# Patient Record
Sex: Male | Born: 1950 | Race: White | Hispanic: No | Smoking: Former smoker
Health system: Southern US, Community
[De-identification: ages and names within clinical notes are randomized; demographics above are authoritative.]

## PROBLEM LIST (undated history)

## (undated) DIAGNOSIS — E119 Type 2 diabetes mellitus without complications: Secondary | ICD-10-CM

## (undated) DIAGNOSIS — I509 Heart failure, unspecified: Secondary | ICD-10-CM

## (undated) DIAGNOSIS — J449 Chronic obstructive pulmonary disease, unspecified: Secondary | ICD-10-CM

## (undated) DIAGNOSIS — F419 Anxiety disorder, unspecified: Secondary | ICD-10-CM

## (undated) DIAGNOSIS — I10 Essential (primary) hypertension: Secondary | ICD-10-CM

## (undated) HISTORY — PX: CORONARY ARTERY BYPASS GRAFT: SHX141

## (undated) HISTORY — PX: CARDIAC CATHETERIZATION: SHX172

## (undated) HISTORY — PX: BACK SURGERY: SHX140

---

## 2014-09-26 ENCOUNTER — Inpatient Hospital Stay (HOSPITAL_COMMUNITY): Payer: Medicaid - Out of State

## 2014-09-26 ENCOUNTER — Inpatient Hospital Stay (HOSPITAL_COMMUNITY)
Admission: EM | Admit: 2014-09-26 | Discharge: 2014-09-29 | DRG: 208 | Disposition: A | Discharge status: Home / Self Care | Payer: Medicaid - Out of State | Source: Other Acute Inpatient Hospital | Attending: Internal Medicine | Admitting: Internal Medicine

## 2014-09-26 DIAGNOSIS — Z9981 Dependence on supplemental oxygen: Secondary | ICD-10-CM | POA: Diagnosis not present

## 2014-09-26 DIAGNOSIS — J9601 Acute respiratory failure with hypoxia: Secondary | ICD-10-CM | POA: Diagnosis present

## 2014-09-26 DIAGNOSIS — Y95 Nosocomial condition: Secondary | ICD-10-CM | POA: Diagnosis present

## 2014-09-26 DIAGNOSIS — J189 Pneumonia, unspecified organism: Secondary | ICD-10-CM | POA: Diagnosis present

## 2014-09-26 DIAGNOSIS — G8929 Other chronic pain: Secondary | ICD-10-CM | POA: Diagnosis present

## 2014-09-26 DIAGNOSIS — G4733 Obstructive sleep apnea (adult) (pediatric): Secondary | ICD-10-CM | POA: Diagnosis present

## 2014-09-26 DIAGNOSIS — J441 Chronic obstructive pulmonary disease with (acute) exacerbation: Secondary | ICD-10-CM | POA: Diagnosis present

## 2014-09-26 DIAGNOSIS — F418 Other specified anxiety disorders: Secondary | ICD-10-CM | POA: Diagnosis present

## 2014-09-26 DIAGNOSIS — E119 Type 2 diabetes mellitus without complications: Secondary | ICD-10-CM | POA: Diagnosis present

## 2014-09-26 DIAGNOSIS — Z4659 Encounter for fitting and adjustment of other gastrointestinal appliance and device: Secondary | ICD-10-CM

## 2014-09-26 DIAGNOSIS — F17210 Nicotine dependence, cigarettes, uncomplicated: Secondary | ICD-10-CM | POA: Diagnosis present

## 2014-09-26 DIAGNOSIS — J9690 Respiratory failure, unspecified, unspecified whether with hypoxia or hypercapnia: Secondary | ICD-10-CM | POA: Diagnosis present

## 2014-09-26 DIAGNOSIS — G629 Polyneuropathy, unspecified: Secondary | ICD-10-CM | POA: Diagnosis present

## 2014-09-26 DIAGNOSIS — Z7982 Long term (current) use of aspirin: Secondary | ICD-10-CM

## 2014-09-26 DIAGNOSIS — M549 Dorsalgia, unspecified: Secondary | ICD-10-CM | POA: Diagnosis present

## 2014-09-26 DIAGNOSIS — Z09 Encounter for follow-up examination after completed treatment for conditions other than malignant neoplasm: Secondary | ICD-10-CM

## 2014-09-26 DIAGNOSIS — M4806 Spinal stenosis, lumbar region: Secondary | ICD-10-CM | POA: Diagnosis present

## 2014-09-26 DIAGNOSIS — D649 Anemia, unspecified: Secondary | ICD-10-CM | POA: Diagnosis present

## 2014-09-26 DIAGNOSIS — J9621 Acute and chronic respiratory failure with hypoxia: Secondary | ICD-10-CM | POA: Diagnosis present

## 2014-09-26 DIAGNOSIS — I10 Essential (primary) hypertension: Secondary | ICD-10-CM | POA: Diagnosis present

## 2014-09-26 DIAGNOSIS — J439 Emphysema, unspecified: Secondary | ICD-10-CM | POA: Diagnosis not present

## 2014-09-26 DIAGNOSIS — I252 Old myocardial infarction: Secondary | ICD-10-CM | POA: Diagnosis not present

## 2014-09-26 DIAGNOSIS — I5023 Acute on chronic systolic (congestive) heart failure: Secondary | ICD-10-CM | POA: Diagnosis present

## 2014-09-26 DIAGNOSIS — I2510 Atherosclerotic heart disease of native coronary artery without angina pectoris: Secondary | ICD-10-CM | POA: Diagnosis present

## 2014-09-26 HISTORY — DX: Heart failure, unspecified: I50.9

## 2014-09-26 HISTORY — DX: Chronic obstructive pulmonary disease, unspecified: J44.9

## 2014-09-26 HISTORY — DX: Essential (primary) hypertension: I10

## 2014-09-26 HISTORY — DX: Type 2 diabetes mellitus without complications: E11.9

## 2014-09-26 HISTORY — DX: Anxiety disorder, unspecified: F41.9

## 2014-09-26 LAB — CBC WITH DIFFERENTIAL/PLATELET
BASOS ABS: 0 10*3/uL (ref 0.0–0.1)
BASOS PCT: 0 % (ref 0–1)
EOS PCT: 2 % (ref 0–5)
Eosinophils Absolute: 0.2 10*3/uL (ref 0.0–0.7)
HEMATOCRIT: 35.2 % — AB (ref 39.0–52.0)
Hemoglobin: 11.1 g/dL — ABNORMAL LOW (ref 13.0–17.0)
Lymphocytes Relative: 20 % (ref 12–46)
Lymphs Abs: 1.6 10*3/uL (ref 0.7–4.0)
MCH: 27.1 pg (ref 26.0–34.0)
MCHC: 31.5 g/dL (ref 30.0–36.0)
MCV: 85.9 fL (ref 78.0–100.0)
Monocytes Absolute: 0.5 10*3/uL (ref 0.1–1.0)
Monocytes Relative: 7 % (ref 3–12)
Neutro Abs: 5.8 10*3/uL (ref 1.7–7.7)
Neutrophils Relative %: 71 % (ref 43–77)
Platelets: 294 10*3/uL (ref 150–400)
RBC: 4.1 MIL/uL — ABNORMAL LOW (ref 4.22–5.81)
RDW: 17.4 % — AB (ref 11.5–15.5)
WBC: 8.1 10*3/uL (ref 4.0–10.5)

## 2014-09-26 LAB — COMPREHENSIVE METABOLIC PANEL
ALK PHOS: 79 U/L (ref 39–117)
ALT: 13 U/L (ref 0–53)
ANION GAP: 7 (ref 5–15)
AST: 15 U/L (ref 0–37)
Albumin: 3.3 g/dL — ABNORMAL LOW (ref 3.5–5.2)
BUN: 13 mg/dL (ref 6–23)
CALCIUM: 8.9 mg/dL (ref 8.4–10.5)
CO2: 30 mmol/L (ref 19–32)
Chloride: 101 mmol/L (ref 96–112)
Creatinine, Ser: 1.02 mg/dL (ref 0.50–1.35)
GFR calc Af Amer: 88 mL/min — ABNORMAL LOW (ref >90)
GFR, EST NON AFRICAN AMERICAN: 76 mL/min — AB (ref >90)
Glucose, Bld: 108 mg/dL — ABNORMAL HIGH (ref 70–99)
Potassium: 4 mmol/L (ref 3.5–5.1)
Sodium: 138 mmol/L (ref 135–145)
TOTAL PROTEIN: 7.1 g/dL (ref 6.0–8.3)
Total Bilirubin: 1.1 mg/dL (ref 0.3–1.2)

## 2014-09-26 LAB — GLUCOSE, CAPILLARY
GLUCOSE-CAPILLARY: 104 mg/dL — AB (ref 70–99)
GLUCOSE-CAPILLARY: 107 mg/dL — AB (ref 70–99)
GLUCOSE-CAPILLARY: 100 mg/dL — AB (ref 70–99)
Glucose-Capillary: 103 mg/dL — ABNORMAL HIGH (ref 70–99)
Glucose-Capillary: 138 mg/dL — ABNORMAL HIGH (ref 70–99)
Glucose-Capillary: 142 mg/dL — ABNORMAL HIGH (ref 70–99)

## 2014-09-26 LAB — MRSA PCR SCREENING: MRSA BY PCR: NEGATIVE

## 2014-09-26 LAB — POCT I-STAT 3, ART BLOOD GAS (G3+)
ACID-BASE EXCESS: 2 mmol/L (ref 0.0–2.0)
BICARBONATE: 28 meq/L — AB (ref 20.0–24.0)
O2 Saturation: 97 %
PCO2 ART: 47.7 mmHg — AB (ref 35.0–45.0)
PO2 ART: 98 mmHg (ref 80.0–100.0)
TCO2: 29 mmol/L (ref 0–100)
pH, Arterial: 7.378 (ref 7.350–7.450)

## 2014-09-26 LAB — TROPONIN I
Troponin I: 0.13 ng/mL — ABNORMAL HIGH (ref <0.031)
Troponin I: 0.12 ng/mL — ABNORMAL HIGH (ref <0.031)
Troponin I: 0.09 ng/mL — ABNORMAL HIGH (ref <0.031)

## 2014-09-26 MED ORDER — DEXTROSE 5 % IV SOLN
500.0000 mg | INTRAVENOUS | Status: DC
  Administered 2014-09-26 – 2014-09-27 (×2): 500 mg via INTRAVENOUS
  Filled 2014-09-26 (×2): qty 500

## 2014-09-26 MED ORDER — ALBUTEROL SULFATE (2.5 MG/3ML) 0.083% IN NEBU
2.5000 mg | INHALATION_SOLUTION | RESPIRATORY_TRACT | Status: DC | PRN
  Administered 2014-09-29: 2.5 mg via RESPIRATORY_TRACT
  Filled 2014-09-26: qty 3

## 2014-09-26 MED ORDER — FENTANYL CITRATE 0.05 MG/ML IJ SOLN
50.0000 ug | Freq: Once | INTRAMUSCULAR | Status: DC
Start: 2014-09-26 — End: 2014-09-27

## 2014-09-26 MED ORDER — ASPIRIN 81 MG PO CHEW
81.0000 mg | CHEWABLE_TABLET | Freq: Every day | ORAL | Status: DC
  Administered 2014-09-26 – 2014-09-29 (×4): 81 mg
  Filled 2014-09-26 (×4): qty 1

## 2014-09-26 MED ORDER — CEFTRIAXONE SODIUM IN DEXTROSE 20 MG/ML IV SOLN
1.0000 g | INTRAVENOUS | Status: DC
  Administered 2014-09-26 – 2014-09-29 (×4): 1 g via INTRAVENOUS
  Filled 2014-09-26 (×5): qty 50

## 2014-09-26 MED ORDER — QUETIAPINE FUMARATE 100 MG PO TABS
100.0000 mg | ORAL_TABLET | Freq: Once | ORAL | Status: AC
  Administered 2014-09-26: 100 mg via ORAL
  Filled 2014-09-26: qty 1

## 2014-09-26 MED ORDER — CHLORHEXIDINE GLUCONATE 0.12 % MT SOLN
15.0000 mL | Freq: Two times a day (BID) | OROMUCOSAL | Status: DC
  Administered 2014-09-26 (×2): 15 mL via OROMUCOSAL
  Filled 2014-09-26 (×3): qty 15

## 2014-09-26 MED ORDER — IPRATROPIUM-ALBUTEROL 0.5-2.5 (3) MG/3ML IN SOLN
3.0000 mL | Freq: Four times a day (QID) | RESPIRATORY_TRACT | Status: DC
  Administered 2014-09-26 – 2014-09-29 (×13): 3 mL via RESPIRATORY_TRACT
  Filled 2014-09-26 (×11): qty 3

## 2014-09-26 MED ORDER — POTASSIUM CHLORIDE 20 MEQ/15ML (10%) PO SOLN
40.0000 meq | Freq: Every day | ORAL | Status: DC
  Filled 2014-09-26: qty 30

## 2014-09-26 MED ORDER — HEPARIN SODIUM (PORCINE) 5000 UNIT/ML IJ SOLN
5000.0000 [IU] | Freq: Three times a day (TID) | INTRAMUSCULAR | Status: DC
  Administered 2014-09-26 – 2014-09-29 (×10): 5000 [IU] via SUBCUTANEOUS
  Filled 2014-09-26 (×11): qty 1

## 2014-09-26 MED ORDER — FUROSEMIDE 10 MG/ML IJ SOLN
20.0000 mg | Freq: Every day | INTRAMUSCULAR | Status: DC
  Administered 2014-09-26 – 2014-09-27 (×2): 20 mg via INTRAVENOUS
  Filled 2014-09-26 (×3): qty 2

## 2014-09-26 MED ORDER — PANTOPRAZOLE SODIUM 40 MG PO PACK
40.0000 mg | PACK | Freq: Every day | ORAL | Status: DC
  Administered 2014-09-26 – 2014-09-27 (×2): 40 mg
  Filled 2014-09-26 (×2): qty 20

## 2014-09-26 MED ORDER — SODIUM CHLORIDE 0.9 % IV BOLUS (SEPSIS)
500.0000 mL | Freq: Once | INTRAVENOUS | Status: AC
  Administered 2014-09-26: 500 mL via INTRAVENOUS

## 2014-09-26 MED ORDER — FUROSEMIDE 10 MG/ML IJ SOLN
40.0000 mg | Freq: Every day | INTRAMUSCULAR | Status: DC
Start: 2014-09-26 — End: 2014-09-26

## 2014-09-26 MED ORDER — ALPRAZOLAM 0.5 MG PO TABS
1.0000 mg | ORAL_TABLET | Freq: Three times a day (TID) | ORAL | Status: DC | PRN
  Administered 2014-09-26 – 2014-09-27 (×2): 1 mg via ORAL
  Filled 2014-09-26 (×2): qty 2

## 2014-09-26 MED ORDER — SODIUM CHLORIDE 0.9 % IV SOLN
25.0000 ug/h | INTRAVENOUS | Status: DC
  Administered 2014-09-26: 25 ug/h via INTRAVENOUS
  Filled 2014-09-26 (×2): qty 50

## 2014-09-26 MED ORDER — CLOPIDOGREL BISULFATE 75 MG PO TABS
75.0000 mg | ORAL_TABLET | Freq: Every day | ORAL | Status: DC
  Administered 2014-09-26 – 2014-09-29 (×4): 75 mg
  Filled 2014-09-26 (×5): qty 1

## 2014-09-26 MED ORDER — FENTANYL BOLUS VIA INFUSION
50.0000 ug | INTRAVENOUS | Status: DC | PRN
  Administered 2014-09-26 (×2): 50 ug via INTRAVENOUS
  Filled 2014-09-26: qty 50

## 2014-09-26 MED ORDER — SODIUM CHLORIDE 0.9 % IV SOLN: 250.0000 mL | INTRAVENOUS | Status: DC | PRN

## 2014-09-26 MED ORDER — FUROSEMIDE 10 MG/ML IJ SOLN
40.0000 mg | Freq: Once | INTRAMUSCULAR | Status: AC
  Administered 2014-09-26: 40 mg via INTRAVENOUS
  Filled 2014-09-26: qty 4

## 2014-09-26 MED ORDER — SODIUM CHLORIDE 0.9 % IV SOLN: INTRAVENOUS | Status: DC

## 2014-09-26 MED ORDER — POTASSIUM CHLORIDE 20 MEQ/15ML (10%) PO SOLN
40.0000 meq | Freq: Once | ORAL | Status: AC
  Administered 2014-09-26: 40 meq
  Filled 2014-09-26: qty 30

## 2014-09-26 MED ORDER — PANTOPRAZOLE SODIUM 40 MG IV SOLR: 40.0000 mg | INTRAVENOUS | Status: DC

## 2014-09-26 MED ORDER — QUETIAPINE FUMARATE 100 MG PO TABS: 100.0000 mg | ORAL_TABLET | Freq: Every day | ORAL | Status: DC

## 2014-09-26 MED ORDER — QUETIAPINE FUMARATE 200 MG PO TABS
200.0000 mg | ORAL_TABLET | Freq: Every day | ORAL | Status: DC
  Administered 2014-09-26 – 2014-09-28 (×3): 200 mg via ORAL
  Filled 2014-09-26 (×4): qty 1

## 2014-09-26 MED ORDER — CARVEDILOL 3.125 MG PO TABS
3.1250 mg | ORAL_TABLET | Freq: Two times a day (BID) | ORAL | Status: DC
  Administered 2014-09-26 (×2): 3.125 mg via ORAL
  Filled 2014-09-26 (×5): qty 1

## 2014-09-26 MED ORDER — INSULIN ASPART 100 UNIT/ML ~~LOC~~ SOLN
0.0000 [IU] | SUBCUTANEOUS | Status: DC
  Administered 2014-09-26 (×2): 1 [IU] via SUBCUTANEOUS

## 2014-09-26 MED ORDER — FUROSEMIDE 10 MG/ML IJ SOLN
40.0000 mg | Freq: Once | INTRAMUSCULAR | Status: AC
  Administered 2014-09-26: 40 mg via INTRAVENOUS

## 2014-09-26 MED ORDER — VANCOMYCIN HCL IN DEXTROSE 1-5 GM/200ML-% IV SOLN
1000.0000 mg | Freq: Three times a day (TID) | INTRAVENOUS | Status: DC
  Administered 2014-09-26 – 2014-09-28 (×6): 1000 mg via INTRAVENOUS
  Filled 2014-09-26 (×10): qty 200

## 2014-09-26 MED ORDER — METHYLPREDNISOLONE SODIUM SUCC 40 MG IJ SOLR
40.0000 mg | INTRAMUSCULAR | Status: AC
  Administered 2014-09-27: 40 mg via INTRAVENOUS
  Filled 2014-09-26: qty 1

## 2014-09-26 MED ORDER — METHYLPREDNISOLONE SODIUM SUCC 40 MG IJ SOLR
40.0000 mg | Freq: Four times a day (QID) | INTRAMUSCULAR | Status: DC
  Administered 2014-09-26: 40 mg via INTRAVENOUS
  Filled 2014-09-26 (×5): qty 1

## 2014-09-26 MED ORDER — IPRATROPIUM-ALBUTEROL 0.5-2.5 (3) MG/3ML IN SOLN
RESPIRATORY_TRACT | Status: AC
  Administered 2014-09-26: 3 mL via RESPIRATORY_TRACT
  Filled 2014-09-26: qty 3

## 2014-09-26 MED ORDER — CETYLPYRIDINIUM CHLORIDE 0.05 % MT LIQD
7.0000 mL | Freq: Four times a day (QID) | OROMUCOSAL | Status: DC
Start: 2014-09-26 — End: 2014-09-27
  Administered 2014-09-26 – 2014-09-27 (×4): 7 mL via OROMUCOSAL

## 2014-09-26 MED ORDER — NICOTINE 21 MG/24HR TD PT24
21.0000 mg | MEDICATED_PATCH | Freq: Every day | TRANSDERMAL | Status: DC
  Administered 2014-09-26 – 2014-09-29 (×4): 21 mg via TRANSDERMAL
  Filled 2014-09-26 (×4): qty 1

## 2014-09-26 NOTE — Progress Notes (Signed)
ANTIBIOTIC CONSULT NOTE - INITIAL  Pharmacy Consult for vanco Indication: pneumonia  No Known Allergies  Patient Measurements: Weight: 180 lb 5.4 oz (81.8 kg) Adjusted Body Weight:    Vital Signs: Temp: 98 F (36.7 C) (02/18 0848) Temp Source: Oral (02/18 0848) BP: 126/67 mmHg (02/18 0800) Pulse Rate: 103 (02/18 0800) Intake/Output from previous day: 02/17 0701 - 02/18 0700 In: 330.9 [I.V.:30.9; IV Piggyback:300] Out: 1800 [Urine:1800] Intake/Output from this shift:    Labs:  Recent Labs  09/26/14 0330  WBC 8.1  HGB 11.1*  PLT 294  CREATININE 1.02   CrCl cannot be calculated (Unknown ideal weight.). No results for input(s): VANCOTROUGH, VANCOPEAK, VANCORANDOM, GENTTROUGH, GENTPEAK, GENTRANDOM, TOBRATROUGH, TOBRAPEAK, TOBRARND, AMIKACINPEAK, AMIKACINTROU, AMIKACIN in the last 72 hours.   Microbiology: Recent Results (from the past 720 hour(s))  MRSA PCR Screening     Status: None   Collection Time: 09/26/14  2:47 AM  Result Value Ref Range Status   MRSA by PCR NEGATIVE NEGATIVE Final    Comment:        The GeneXpert MRSA Assay (FDA approved for NASAL specimens only), is one component of a comprehensive MRSA colonization surveillance program. It is not intended to diagnose MRSA infection nor to guide or monitor treatment for MRSA infections.     Medical History: No past medical history on file.   Medications: f/u med rec  Assessment: [facility]  Anticoagulation: SQ heparin. Hgb 11.1. Plts ok  Infectious Disease: HCAP vs COPD exac. Afebrile. WBC 8.1.Rocephin/Azithro. Add Vanco empirically and deescalate if trach aspirate negative.  Cardiovascular: HF with EF 35%, MI, CABG, HTN, VSs but HR 103. Meds: ASA81, Plavix, IV lasix, tube PPI  Endocrinology: DM on SSI  Gastrointestinal / Nutrition: IV PPI  Neurology: Lumbar stenosis with neurogenic claudication,  Depression/Anxiety, schizophrenia.  Nicotine patch, sedated on Fent drip. Resume Seroquel  Nephrology: Scr 1.02  Pulmonary: OSA, COPD, +tobacco (1ppd, quite 2wks ago, requires patch), 3L o2 at home. VDRF on nebs, Solumedrol, Wean.   Hematology / Oncology  PTA Medication Issues: f/u med rec  Best Practices: SQ heparin, PPI, mouth care  Goal of Therapy:  Vancomycin trough level 15-20 mcg/ml  Plan:  Vancomycin 1g IV q8hr. Trough after 3-5 doses at steady state if continued.   Ciarrah Rae S. Merilynn Finlandobertson, PharmD, BCPS Clinical Staff Pharmacist Pager [phone]  Misty Stanleyobertson, Kennet Mccort Stillinger 09/26/2014,9:14 AM

## 2014-09-26 NOTE — H&P (Signed)
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 EDP thought he was in acute HF due to elevated BNP (~500); He was given Lasix and intubated prior to transfer. He reports fevers/chills, increased SOB and sputum production recently. Denies current CP, Ab pain.   PAST MEDICAL HISTORY :   has no past medical history on file.  has no past surgical history on file. Prior to Admission medications   Not on File   Allergies not on file  FAMILY HISTORY:  has no family status information on file.  SOCIAL HISTORY:    REVIEW OF SYSTEMS:  Unable to obtain; Intubated  SUBJECTIVE:   VITAL SIGNS: FiO2 (%):  [70 %] 70 % (02/18 0245) HEMODYNAMICS:   VENTILATOR SETTINGS: Vent Mode:  [-] PRVC FiO2 (%):  [70 %] 70 % Set Rate:  [14 bmp] 14 bmp Vt Set:  [500 mL] 500 mL PEEP:  [5 cmH20] 5 cmH20 INTAKE / OUTPUT: No intake or output data in the 24 hours ending 09/26/14 0252  PHYSICAL EXAMINATION: General:  Male, Intubated, NAD Neuro:  Alert; Follows commands; Nonfocal exam HEENT:  MMM; ET tube in place Cardiovascular:  RRR no m/r/g Lungs:  Diffusely decreased breath sound; No crackles  Abdomen:  SNTND Musculoskeletal:   Normal tone Skin:  No rash  LABS:  CBC No results for input(s): WBC, HGB, HCT, PLT in the last 168 hours. Coag's No results for input(s): APTT, INR in the last 168 hours. BMET No results for input(s): NA, K, CL, CO2, BUN, CREATININE, GLUCOSE in the last 168 hours. Electrolytes No results for input(s): CALCIUM, MG, PHOS in the last 168 hours. Sepsis Markers No results for input(s): LATICACIDVEN, PROCALCITON, O2SATVEN in the last 168 hours. ABG No results for input(s): PHART, PCO2ART, PO2ART in the last 168 hours. Liver Enzymes No results for input(s): AST, ALT, ALKPHOS, BILITOT, ALBUMIN in the last 168 hours. Cardiac Enzymes No results for input(s): TROPONINI, PROBNP in the last 168 hours. Glucose No results for input(s): GLUCAP in the last 168 hours.  Imaging No results found.   ASSESSMENT / PLAN:  PULMONARY OETT 2/18>> A: Hypercarbic respiratory failure COPD w/ home O2 3L OSA on CPAP Smoker P:   Full Vent Solumedrol  q6hrs Dounebs  CARDIOVASCULAR A:  HTN Hx of MI Hx of CABGx4 P:  Continue ASA 81 mg & Plavix Lasix IV 40 BID w/ Potassium  qd Holding home: Lipitor, Coreg, Norvasc; Lisinopril  RENAL A:   P:   Check bmet  GASTROINTESTINAL A:   P:   NPO KVO fluids PPI IV BID; Holding home pepcid  HEMATOLOGIC A:   Anemia - Normocytic  P:  - Hgb 11.1 - No acute signs of bleeding - Monitor cbc  INFECTIOUS A:  CAP P:   BCx2 None UC None Sputum None Abx: CTX & Azithro, start date 2/18, day 1/7-10  ENDOCRINE A:    DM2 - No home meds P:   CBG q 4 w/ SSI  NEUROLOGIC A:   Lumbar stenosis with neurogenic claudication Depression/Anxiety  P:   Holding home Gabapentin; Oxycodone; Robaxin; xanax; Seroquel RASS goal: 0, -1  FAMILY  - Updates: No family at bedside  - Inter-disciplinary family meet or Palliative Care meeting due by:  10/02/14  Jamal CollinJames R Preesha Benjamin, MD 09/26/2014, 4:02 AM PGY-2, [facility]  Pulmonary and  Critical Care Medicine  HealthCare Pager: [phone]

## 2014-09-26 NOTE — Procedures (Signed)
Extubation Procedure Note  Patient Details:   Name: Eric Hensley DOB: March 22, 1951 MRN: [id number]   Pt self-extubated. Pt placed on NRB post extubation.  Evaluation  O2 sats: stable throughout Complications: No apparent complications Patient did tolerate procedure well. Bilateral Breath Sounds: Diminished   Yes  Fredrich BirksMarshburn, Kara Melching Lynne 09/26/2014, 11:32 PM

## 2014-09-26 NOTE — Progress Notes (Addendum)
Self extubated; nearly    Looks ok on exam  GAve good cough and followed commands Appears calm  PULMONARY  Recent Labs Lab 09/26/14 0423  PHART 7.378  PCO2ART 47.7*  PO2ART 98.0  HCO3 28.0*  TCO2 29  O2SAT 97.0    CBC  Recent Labs Lab 09/26/14 0330  HGB 11.1*  HCT 35.2*  WBC 8.1  PLT 294    COAGULATION No results for input(s): INR in the last 168 hours.  CARDIAC   Recent Labs Lab 09/26/14 0330 09/26/14 1440 09/26/14 2052  TROPONINI 0.13* 0.12* 0.09*   No results for input(s): PROBNP in the last 168 hours.   CHEMISTRY  Recent Labs Lab 09/26/14 0330  NA 138  K 4.0  CL 101  CO2 30  GLUCOSE 108*  BUN 13  CREATININE 1.02  CALCIUM 8.9   Estimated Creatinine Clearance: 73 mL/min (by C-G formula based on Cr of 1.02).   LIVER  Recent Labs Lab 09/26/14 0330  AST 15  ALT 13  ALKPHOS 79  BILITOT 1.1  PROT 7.1  ALBUMIN 3.3*     INFECTIOUS No results for input(s): LATICACIDVEN, PROCALCITON in the last 168 hours.   ENDOCRINE CBG (last 3)   Recent Labs  09/26/14 1145 09/26/14 1626 09/26/14 1958  GLUCAP 142* 107* 100*         IMAGING x48h Portable Chest Xray  09/26/2014   CLINICAL DATA:  Hypoxia, dyspnea.  Intubated.  EXAM: PORTABLE CHEST - 1 VIEW  COMPARISON:  None.  FINDINGS: The endotracheal tube tip is just below the level of the clavicular heads.  There is prior sternotomy. There is moderate cardiomegaly. There are basilar opacities bilaterally, extending up into the central perihilar regions. There is no pneumothorax. Vascular and interstitial congestive changes are present.  IMPRESSION: ETT tip just below the clavicular heads.  The vascular and interstitial changes suggest congestive heart failure. Alveolar edema or infiltrates are present in the central and basilar regions.   Electronically Signed   By: Ellery Plunkaniel R Mitchell M.D.   On: 09/26/2014 03:30   Dg Abd Portable 1v  09/26/2014   CLINICAL DATA:  Or gastric tube placement   EXAM: PORTABLE ABDOMEN - 1 VIEW  COMPARISON:  None.  FINDINGS: The orogastric tube extends through the stomach with tip in the duodenal bulb  IMPRESSION: Gastric tube tip in the duodenal bulb   Electronically Signed   By: Ellery Plunkaniel R Mitchell M.D.   On: 09/26/2014 04:09       Plan Complete extubation strt bipap Stat cxr Lasix 40mg  x 1  Check bnp   Dr. Kalman ShanMurali Cree Napoli, M.D., [facility].P Pulmonary and Critical Care Medicine Staff Physician [place]  Pulmonary and Critical Care Pager: [phone], If no answer or between  15:00h - 7:00h: call [phone]  09/26/2014 10:56 PM

## 2014-09-26 NOTE — Progress Notes (Addendum)
Pt was scratching his head and accidentally snagged his breathing tube and partially self extubated. RT called to bedside. Pola CornELINK MD notified. Continuous sedation stopped.  ET tube removed per MD order. Pt placed on non-rebreather with an order for Bipap.  Pt coughing on demand and following all commands.  Will continue to monitor closely.

## 2014-09-26 NOTE — Progress Notes (Signed)
64yo [facility] for [facility] failure w/ concern for CHF exacerbation, to begin IV ABX.  Rec'd Levaquin at OSH.  Will start Rocephin 1g IV Q24H and azithromycin 500mg  IV Q24H and monitor CBC, Cx.  Vernard GamblesVeronda Renardo Cheatum, PharmD, BCPS 09/26/2014 3:35 AM

## 2014-09-26 NOTE — Progress Notes (Signed)
eLink Physician-Brief Progress Note Patient Name: Eric Hensley DOB: 30-Mar-1951 MRN: [id number]   Date of Service  09/26/2014  HPI/Events of Note  [facility] Acute hypoxemic respiratory failure, concern for CHF exacerbation; Per OSH EDP baseline LVEF 35%  eICU Interventions  Sedation and vent orders written ABG/CXR now Resident to evaluate patient now     Intervention Category Major Interventions: Respiratory failure - evaluation and management Evaluation Type: New Patient Evaluation  MCQUAID, DOUGLAS 09/26/2014, 3:03 AM

## 2014-09-26 NOTE — Progress Notes (Signed)
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 EDP thought he was in acute HF due to elevated BNP (~500); He was given Lasix and intubated prior to transfer. He reports fevers/chills, increased SOB and sputum production recently. Denies current CP, Ab pain.  SUBJECTIVE: Per POA/Friend hospitalized for several days ~ 1 week ago in CCU not on vent.   VITAL SIGNS: Temp:  [98 F (36.7 C)-99.4 F (37.4 C)] 98 F (36.7 C) (02/18 0848) Pulse Rate:  [92-110] 103 (02/18 0800) Resp:  [11-21] 11 (02/18 0800) BP: (106-131)/(67-94) 126/67 mmHg (02/18 0800) SpO2:  [93 %-100 %] 93 % (02/18 0800) FiO2 (%):  [50 %-70 %] 50 % (02/18 0714) Weight:  [180 lb 5.4 oz (81.8 kg)] 180 lb 5.4 oz (81.8 kg) (02/18 0354) HEMODYNAMICS:   VENTILATOR SETTINGS: Vent Mode:  [-] PRVC FiO2 (%):  [50 %-70 %] 50 % Set Rate:  [14 bmp] 14 bmp Vt Set:  [500 mL] 500 mL PEEP:  [5 cmH20] 5 cmH20 Plateau Pressure:  [13 cmH20] 13 cmH20 INTAKE / OUTPUT:  Intake/Output Summary (Last 24 hours) at 09/26/14 0916 Last data filed at 09/26/14 0700  Gross per 24 hour  Intake 330.92 ml  Output   1800 ml   Net -1469.08 ml    PHYSICAL EXAMINATION: General: Male, Intubated, NAD Neuro: Alert; Follows commands; Nonfocal exam HEENT: MMM; ET tube in place Cardiovascular: RRR no m/r/g Lungs: Diffusely decreased breath sound; No crackles  Abdomen: SNTND Musculoskeletal: Normal tone Skin: No rash  LABS:  CBC  Recent Labs Lab 09/26/14 0330  WBC 8.1  HGB 11.1*  HCT 35.2*  PLT 294   Coag's No results for input(s): APTT, INR in the last 168 hours. BMET  Recent Labs Lab 09/26/14 0330  NA 138  K 4.0  CL 101  CO2 30  BUN 13  CREATININE 1.02  GLUCOSE 108*   Electrolytes  Recent Labs Lab 09/26/14 0330  CALCIUM 8.9   Sepsis Markers No results for input(s): LATICACIDVEN, PROCALCITON, O2SATVEN in the last 168 hours. ABG  Recent Labs Lab 09/26/14 0423  PHART 7.378  PCO2ART 47.7*  PO2ART 98.0   Liver Enzymes  Recent Labs Lab 09/26/14 0330  AST 15  ALT 13  ALKPHOS 79  BILITOT 1.1  ALBUMIN 3.3*   Cardiac Enzymes  Recent Labs Lab 09/26/14 0330  TROPONINI 0.13*   Glucose  Recent Labs Lab 09/26/14 0258 09/26/14 0424 09/26/14 0743  GLUCAP 104* 103* 138*    Imaging No results found.   ASSESSMENT / PLAN:  PULMONARY OETT 2/18>> A: Hypercarbic respiratory failure COPD w/ home O2 3L OSA on CPAP Smoker P:  Full Vent - Attempt to wean today; Will  need [place] O2 @ at least 3 litters and CPAP at night Solumedrol  q24hrs Dounebs  CARDIOVASCULAR A:  HTN Hx of MI Hx of CABGx4 P:  Continue ASA 81 mg & Plavix Lasix IV 20 qd; Home dose Holding home: Lipitor, Norvasc; Lisinopril Restart home Coreg 3.125 BID  RENAL A:  P:  Check bmet  GASTROINTESTINAL A:  P:  NPO KVO fluids PPI IV BID; Holding home pepcid  HEMATOLOGIC A:  Anemia - Normocytic  P:  - Hgb 11.1 - No acute signs of bleeding - Monitor cbc  INFECTIOUS A: CAP P:  BCx2 None UC None Trach asp >> ordered   Abx: Vanc 2/18 >> abx: CTX &  Azithro, start date 2/18, day 1/7-10  ENDOCRINE A:  DM2 - No home meds P:  CBG q 4 w/ SSI  NEUROLOGIC A:  Lumbar stenosis with neurogenic claudication Depression/Anxiety  P:  Holding home Gabapentin; Oxycodone; Robaxin;  Restart home xanax & Seroquel RASS goal: 0, -1  FAMILY  - Updates: Girl friend and Friend/POA updated at bedside  - Inter-disciplinary family meet or Palliative Care meeting due by: 10/02/14  TODAY'S SUMMARY:  Given recent hospitalization will add Vanc to treat for possible HCAP and get trach aspirate though clinically improved. Wean Vent today.    Jamal Collin, MD 09/26/2014, 9:16 AM PGY-2, [facility]

## 2014-09-27 ENCOUNTER — Encounter (HOSPITAL_COMMUNITY): Payer: Self-pay | Admitting: *Deleted

## 2014-09-27 LAB — BASIC METABOLIC PANEL
Anion gap: 8 (ref 5–15)
BUN: 18 mg/dL (ref 6–23)
CO2: 31 mmol/L (ref 19–32)
CREATININE: 1.04 mg/dL (ref 0.50–1.35)
Calcium: 9 mg/dL (ref 8.4–10.5)
Chloride: 96 mmol/L (ref 96–112)
GFR calc Af Amer: 86 mL/min — ABNORMAL LOW (ref >90)
GFR calc non Af Amer: 74 mL/min — ABNORMAL LOW (ref >90)
GLUCOSE: 112 mg/dL — AB (ref 70–99)
Potassium: 4 mmol/L (ref 3.5–5.1)
Sodium: 135 mmol/L (ref 135–145)

## 2014-09-27 LAB — CBC
HEMATOCRIT: 34.7 % — AB (ref 39.0–52.0)
HEMOGLOBIN: 10.7 g/dL — AB (ref 13.0–17.0)
MCH: 26.8 pg (ref 26.0–34.0)
MCHC: 30.8 g/dL (ref 30.0–36.0)
MCV: 87 fL (ref 78.0–100.0)
Platelets: 293 10*3/uL (ref 150–400)
RBC: 3.99 MIL/uL — ABNORMAL LOW (ref 4.22–5.81)
RDW: 16.8 % — AB (ref 11.5–15.5)
WBC: 9.9 10*3/uL (ref 4.0–10.5)

## 2014-09-27 LAB — TROPONIN I
TROPONIN I: 0.07 ng/mL — AB (ref <0.031)
Troponin I: 0.08 ng/mL — ABNORMAL HIGH (ref <0.031)

## 2014-09-27 LAB — GLUCOSE, CAPILLARY
GLUCOSE-CAPILLARY: 89 mg/dL (ref 70–99)
GLUCOSE-CAPILLARY: 218 mg/dL — AB (ref 70–99)
GLUCOSE-CAPILLARY: 129 mg/dL — AB (ref 70–99)
GLUCOSE-CAPILLARY: 116 mg/dL — AB (ref 70–99)
Glucose-Capillary: 96 mg/dL (ref 70–99)
Glucose-Capillary: 98 mg/dL (ref 70–99)

## 2014-09-27 MED ORDER — FUROSEMIDE 10 MG/ML IJ SOLN: 40.0000 mg | Freq: Once | INTRAMUSCULAR | Status: DC

## 2014-09-27 MED ORDER — INFLUENZA VAC SPLIT QUAD 0.5 ML IM SUSY
0.5000 mL | PREFILLED_SYRINGE | INTRAMUSCULAR | Status: AC
  Administered 2014-09-28: 0.5 mL via INTRAMUSCULAR
  Filled 2014-09-27: qty 0.5

## 2014-09-27 MED ORDER — INSULIN ASPART 100 UNIT/ML ~~LOC~~ SOLN
0.0000 [IU] | Freq: Three times a day (TID) | SUBCUTANEOUS | Status: DC
  Administered 2014-09-27: 3 [IU] via SUBCUTANEOUS
  Administered 2014-09-27: 1 [IU] via SUBCUTANEOUS

## 2014-09-27 MED ORDER — GABAPENTIN 400 MG PO CAPS
800.0000 mg | ORAL_CAPSULE | Freq: Four times a day (QID) | ORAL | Status: DC
Start: 2014-09-27 — End: 2014-09-29
  Administered 2014-09-27 – 2014-09-29 (×10): 800 mg via ORAL
  Filled 2014-09-27 (×12): qty 2

## 2014-09-27 MED ORDER — AZITHROMYCIN 500 MG PO TABS
500.0000 mg | ORAL_TABLET | Freq: Every day | ORAL | Status: DC
  Administered 2014-09-28 – 2014-09-29 (×2): 500 mg via ORAL
  Filled 2014-09-27 (×2): qty 1

## 2014-09-27 MED ORDER — PANTOPRAZOLE SODIUM 40 MG PO TBEC: 40.0000 mg | DELAYED_RELEASE_TABLET | Freq: Every day | ORAL | Status: DC

## 2014-09-27 MED ORDER — GABAPENTIN 800 MG PO TABS
800.0000 mg | ORAL_TABLET | Freq: Four times a day (QID) | ORAL | Status: DC
  Filled 2014-09-27 (×4): qty 1

## 2014-09-27 MED ORDER — PANTOPRAZOLE SODIUM 40 MG PO TBEC
40.0000 mg | DELAYED_RELEASE_TABLET | Freq: Two times a day (BID) | ORAL | Status: DC
  Administered 2014-09-27 – 2014-09-29 (×4): 40 mg via ORAL
  Filled 2014-09-27 (×3): qty 1

## 2014-09-27 MED ORDER — OXYCODONE HCL 5 MG PO TABS
5.0000 mg | ORAL_TABLET | Freq: Four times a day (QID) | ORAL | Status: DC | PRN
  Administered 2014-09-27 – 2014-09-29 (×8): 5 mg via ORAL
  Filled 2014-09-27 (×8): qty 1

## 2014-09-27 MED ORDER — FUROSEMIDE 40 MG PO TABS
40.0000 mg | ORAL_TABLET | Freq: Every day | ORAL | Status: DC
  Administered 2014-09-28 – 2014-09-29 (×2): 40 mg via ORAL
  Filled 2014-09-27 (×2): qty 1

## 2014-09-27 MED ORDER — ATORVASTATIN CALCIUM 20 MG PO TABS
20.0000 mg | ORAL_TABLET | Freq: Every day | ORAL | Status: DC
  Administered 2014-09-27 – 2014-09-28 (×2): 20 mg via ORAL
  Filled 2014-09-27 (×3): qty 1

## 2014-09-27 NOTE — Progress Notes (Signed)
eLink Physician-Brief Progress Note Patient Name: Eric Hensley DOB: 08-Apr-1951 MRN: [id number]   Date of Service  09/27/2014  HPI/Events of Note  No duistress  eICU Interventions  Heart healthy     Intervention Category Minor Interventions: Routine modifications to care plan (e.g. PRN medications for pain, fever)  Nelda BucksFEINSTEIN,DANIEL J. 09/27/2014, 6:05 AM

## 2014-09-27 NOTE — Progress Notes (Signed)
NURSING PROGRESS NOTE  Judeth Hornlmer Hickle [id number] Transfer Data: 09/27/2014 4:44 PM Attending Provider: Lupita Leashouglas B McQuaid, MD WUJ:[id number]:PROVIDER NOT IN SYSTEM Code Status: FULL   Judeth Hornlmer Fortson is a 64 y.o. male patient transferred from 59[facility]  -No acute distress noted.  -No complaints of shortness of breath.  -No complaints of chest pain.   Cardiac Monitoring: Box # 08 in place. Cardiac monitor yields:normal sinus rhythm, occasional premature ventricular beats.  Last Documented Vital Signs: Blood pressure 114/77, pulse 84, temperature 98.6 F (37 C), temperature source Oral, resp. rate 16, height 5\' 5"  (1.651 m), weight 79.017 kg (174 lb 3.2 oz), SpO2 98 %.  IV Fluids:  IV in place, occlusive dsg intact without redness, IV cath wrist left, condition patent and no redness and antecubital right, condition patent and no redness none.   Allergies:  Eggs or egg-derived products  Past Medical History:   has a past medical history of Hypertension; CHF (congestive heart failure); Anxiety; Diabetes mellitus without complication; and COPD (chronic obstructive pulmonary disease).  Past Surgical History:   has past surgical history that includes Coronary artery bypass graft; Cardiac catheterization; and Back surgery.  Social History:   reports that he quit smoking about 2 months ago. He does not have any smokeless tobacco history on file. He reports that he drinks alcohol. He reports that he does not use illicit drugs.  Skin: intact  Patient/Family orientated to room. Information packet given to patient/family. Admission inpatient armband information verified with patient/family to include name and date of birth and placed on patient arm. Side rails up x 2, fall assessment and education completed with patient/family. Patient/family able to verbalize understanding of risk associated with falls and verbalized understanding to call for assistance before getting out of bed. Call light within reach. Patient/family  able to voice and demonstrate understanding of unit orientation instructions.

## 2014-09-27 NOTE — Progress Notes (Signed)
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 EDP thought he was in acute HF due to elevated BNP (~500); He was given Lasix and intubated prior to transfer. He reports fevers/chills, increased SOB and sputum production recently. Denies current CP, Ab pain.   SUBJECTIVE: Feeling much better today. Denies CP. Feels back to his baseline  VITAL SIGNS: Temp:  [97.8 F (36.6 C)-99.1 F (37.3 C)] 98.2 F (36.8 C) (02/19 0730) Pulse Rate:  [63-106] 87 (02/19 0600) Resp:  [7-26] 20 (02/19 0600) BP: (90-132)/(46-83) 98/59 mmHg (02/19 0600) SpO2:  [91 %-100 %] 93 % (02/19 0600) FiO2 (%):  [40 %-50 %] 50 % (02/19 0000) Weight:  [172 lb 6.4 oz (78.2 kg)] 172 lb 6.4 oz (78.2 kg) (02/19 0500) HEMODYNAMICS:   VENTILATOR SETTINGS: Vent Mode:  [-] PRVC FiO2 (%):  [40 %-50 %] 50 % Set Rate:  [14 bmp] 14 bmp Vt Set:  [500 mL] 500 mL PEEP:  [5 cmH20] 5 cmH20 Pressure Support:  [5 cmH20] 5 cmH20 Plateau Pressure:  [17 cmH20-18 cmH20] 18 cmH20 INTAKE / OUTPUT:  Intake/Output Summary  (Last 24 hours) at 09/27/14 0745 Last data filed at 09/27/14 0700  Gross per 24 hour  Intake 1486.85 ml  Output   2475 ml  Net -988.15 ml    PHYSICAL EXAMINATION: General: Male, Sitting in chair; NAD Neuro: Alert; Follows commands HEENT: MMM Cardiovascular: RRR no m/r/g Lungs: Diffusely decreased breath sound; No crackles  Abdomen: SNTND Musculoskeletal: Normal tone Skin: No rash  LABS:  CBC  Recent Labs Lab 09/26/14 0330  WBC 8.1  HGB 11.1*  HCT 35.2*  PLT 294   Coag's No results for input(s): APTT, INR in the last 168 hours. BMET  Recent Labs Lab 09/26/14 0330  NA 138  K 4.0  CL 101  CO2 30  BUN 13  CREATININE 1.02  GLUCOSE 108*   Electrolytes  Recent Labs Lab 09/26/14 0330  CALCIUM 8.9   Sepsis Markers No results for input(s): LATICACIDVEN, PROCALCITON, O2SATVEN in the last 168 hours. ABG  Recent Labs Lab 09/26/14 0423  PHART 7.378  PCO2ART 47.7*  PO2ART 98.0   Liver Enzymes  Recent Labs Lab 09/26/14 0330  AST 15  ALT 13  ALKPHOS 79  BILITOT 1.1  ALBUMIN 3.3*   Cardiac Enzymes  Recent Labs Lab 09/26/14 1440 09/26/14 2052 09/27/14 0315  TROPONINI 0.12* 0.09* 0.08*   Glucose  Recent Labs Lab 09/26/14 0743 09/26/14 1145 09/26/14 1626 09/26/14 1958 09/27/14 0050 09/27/14 0328  GLUCAP 138* 142* 107* 100* 96 89    Imaging Dg Chest Port 1 View  09/26/2014  CLINICAL DATA:  Dyspnea.  Self extubation.  EXAM: PORTABLE CHEST - 1 VIEW  COMPARISON:  09/26/2014 at 3:13  FINDINGS: Endotracheal tube has been removed. There are central and basilar opacities bilaterally, partially cleared compared to the earlier study. No large effusions are evident. There is moderate unchanged cardiomegaly. There is no pneumothorax.  IMPRESSION: Extubated. Improved, with partial clearance of central and basilar opacities bilaterally.   Electronically Signed   By: Ellery Plunkaniel R Mitchell M.D.   On: 09/26/2014 23:11   Portable Chest  Xray  09/26/2014   CLINICAL DATA:  Hypoxia, dyspnea.  Intubated.  EXAM: PORTABLE CHEST - 1 VIEW  COMPARISON:  None.  FINDINGS: The endotracheal tube tip is just below the level of the clavicular heads.  There is prior sternotomy. There is moderate cardiomegaly. There are basilar opacities bilaterally, extending up into the central perihilar regions. There is no pneumothorax. Vascular and interstitial congestive changes are present.  IMPRESSION: ETT tip just below the clavicular heads.  The vascular and interstitial changes suggest congestive heart failure. Alveolar edema or infiltrates are present in the central and basilar regions.   Electronically Signed   By: Ellery Plunkaniel R Mitchell M.D.   On: 09/26/2014 03:30   Dg Abd Portable 1v  09/26/2014   CLINICAL DATA:  Or gastric tube placement  EXAM: PORTABLE ABDOMEN - 1 VIEW  COMPARISON:  None.  FINDINGS: The orogastric tube extends through the stomach with tip in the duodenal bulb  IMPRESSION: Gastric tube tip in the duodenal bulb   Electronically Signed   By: Ellery Plunkaniel R Mitchell M.D.   On: 09/26/2014 04:09     ASSESSMENT / PLAN:  PULMONARY OETT 2/18>>2/18 A: Hypercarbic respiratory failure COPD w/ home O2 3L OSA on CPAP Smoker P:  Back to home [place] O2 @ at 3 litters and CPAP at night [place]  CARDIOVASCULAR A:  HTN Hx of MI Hx of CABGx4 P:  Continue ASA 81 mg; Lipitor & Plavix Lasix PO 40 qd; Home dose Holding home: Norvasc; Lisinopril and Coreg - due to BP  RENAL A:  P: following kidney fcn prn  GASTROINTESTINAL A:  P:  Heart Healthy  KVO fluids home pepcid  HEMATOLOGIC A:  Anemia - Normocytic  P:  - Hgb 11.1 - No acute signs of bleeding - Monitor cbc  INFECTIOUS A: HCAP P:  BCx2 None UC None Trach asp >> GPC in pairs   Abx: Vanc 2/18 >> ; Continue until trach asp results abx: CTX & Azithro, start date 2/18, day 1/7-10  ENDOCRINE A:  DM2 - No home meds P:  CBG AC w/ SSI  NEUROLOGIC A:   Lumbar stenosis with neurogenic claudication Depression/Anxiety  P:  Restart home meds: Gabapentin; Oxycodone; Holding Robaxin Restart home xanax & Seroquel RASS goal: 0, -1  FAMILY  - Updates: Girl friend and Friend/POA updated by phone 2/19  - Inter-disciplinary family meet or Palliative Care meeting due by: 10/02/14  TODAY'S SUMMARY:  Much improved. Transfer to triad hospitalist.    Jamal CollinJames R Dev Dhondt, MD 09/27/2014, 7:45 AM PGY-2, [facility]

## 2014-09-27 NOTE — Progress Notes (Signed)
Pt has become increasingly frustrated with the Bipap mask and keeps trying to readjust mask. Pt states "its not like my one at home."  Pt has been on Bipap for 2 hours. Pt requesting for Bipap to be taken off.   Pt placed on 3L N[place]; which is his home O2 requirments. Explained to pt he could take a break but would have to be placed back on Bipap later. RT made aware. Also spoke to pt about having family bring in his CPAP from home.  Will continue to monitor.

## 2014-09-28 DIAGNOSIS — J9601 Acute respiratory failure with hypoxia: Secondary | ICD-10-CM

## 2014-09-28 DIAGNOSIS — J189 Pneumonia, unspecified organism: Principal | ICD-10-CM

## 2014-09-28 LAB — CULTURE, RESPIRATORY

## 2014-09-28 LAB — GLUCOSE, CAPILLARY
GLUCOSE-CAPILLARY: 87 mg/dL (ref 70–99)
GLUCOSE-CAPILLARY: 103 mg/dL — AB (ref 70–99)
Glucose-Capillary: 118 mg/dL — ABNORMAL HIGH (ref 70–99)
Glucose-Capillary: 86 mg/dL (ref 70–99)

## 2014-09-28 LAB — CULTURE, RESPIRATORY W GRAM STAIN

## 2014-09-28 NOTE — Progress Notes (Signed)
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 Assessment/Plan: 1. Acute on chronic respiratory failure probably secondary to Hcap, copd exacerbation and acute on chronic systolic heart failure.  On telemetry and on rocephin and zithromax. Continue with duonebs as needed. Stopped the vancomycin. Nasal oxygen as needed.    2. CAD and acute on chronic systolic heart failure: He appears to be compensated. Resume lasix, lipitor.    3. Tobacco abuse: On nicotine patch.   Chronic back pain and leg pain/ neuropathy: on neurontin and pain meds.      Code Status: full code.  Family Communication: family at bedside Disposition Plan: pending PT eval.    Consultants:  PCCM.   Procedures: None  Antibiotics:  Rocephin   zothromax 2/20  HPI/Subjective: Reports no sob , or cough, no chest pain.   Objective: Filed Vitals:   09/28/14 1323  BP: 101/65  Pulse: 83  Temp: 98.1 F (36.7 C)  Resp:     Intake/Output Summary (Last 24 hours) at 09/28/14 1544 Last data filed at 09/28/14 1539  Gross per 24 hour  Intake   1312 ml  Output   2300 ml  Net   -988 ml   Filed Weights   09/26/14 0354 09/27/14 0500 09/27/14 1510  Weight: 81.8 kg (180 lb 5.4 oz) 78.2 kg (172 lb 6.4 oz) 79.017 kg (174 lb 3.2 oz)    Exam:   General:  Alert afebrile on 3 lit [place] oxygen now  Cardiovascular: s1s2, no MRG  Respiratory: scattered rhonchi, diminished air entry at bases  Abdomen: soft non tender non distended bowel sounds heard  Musculoskeletal: no pedal edema.   Data Reviewed: Basic Metabolic Panel:  Recent Labs Lab 09/26/14 0330 09/27/14 0850  NA 138 135  K 4.0 4.0  CL 101 96  CO2 30 31  GLUCOSE 108* 112*  BUN 13 18  CREATININE  1.02 1.04  CALCIUM 8.9 9.0   Liver Function Tests:  Recent Labs Lab 09/26/14 0330  AST 15  ALT 13  ALKPHOS 79  BILITOT 1.1  PROT 7.1  ALBUMIN 3.3*   No results for input(s): LIPASE, AMYLASE in the last 168 hours. No results for input(s): AMMONIA in the last 168 hours. CBC:  Recent Labs Lab 09/26/14 0330 09/27/14 0850  WBC 8.1 9.9  NEUTROABS 5.8  --   HGB 11.1* 10.7*  HCT 35.2* 34.7*  MCV 85.9 87.0  PLT 294 293   Cardiac Enzymes:  Recent Labs Lab 09/26/14 0330 09/26/14 1440 09/26/14 2052 09/27/14 0315 09/27/14 0850  TROPONINI 0.13* 0.12* 0.09* 0.08* 0.07*   BNP (last 3 results) No results for input(s): BNP in the last 8760 hours.  ProBNP (last 3 results) No results for input(s): PROBNP in the last 8760 hours.  CBG:  Recent Labs Lab 09/27/14 1109 09/27/14 1714 09/27/14 2219 09/28/14 0806 09/28/14 1153  GLUCAP 218* 129* 116* 87 118*    Recent Results (from the past 240 hour(s))  MRSA PCR Screening     Status: None   Collection Time: 09/26/14  2:47 AM  Result Value Ref Range Status   MRSA by PCR NEGATIVE NEGATIVE Final    Comment:        The GeneXpert MRSA Assay (FDA approved for NASAL specimens only), is one  component of a comprehensive MRSA colonization surveillance program. It is not intended to diagnose MRSA infection nor to guide or monitor treatment for MRSA infections.   Culture, respiratory (NON-Expectorated)     Status: None (Preliminary result)   Collection Time: 09/26/14 11:40 AM  Result Value Ref Range Status   Specimen Description TRACHEAL ASPIRATE  Final   Special Requests NONE  Final   Gram Stain   Final    FEW WBC PRESENT, PREDOMINANTLY PMN NO SQUAMOUS EPITHELIAL CELLS SEEN FEW GRAM POSITIVE COCCI IN PAIRS Performed at [facility]    Culture   Final    Culture reincubated for better growth Performed at [facility]    Report Status PENDING  Incomplete     Studies: Dg Chest Port 1  View  09/26/2014   CLINICAL DATA:  Dyspnea.  Self extubation.  EXAM: PORTABLE CHEST - 1 VIEW  COMPARISON:  09/26/2014 at 3:13  FINDINGS: Endotracheal tube has been removed. There are central and basilar opacities bilaterally, partially cleared compared to the earlier study. No large effusions are evident. There is moderate unchanged cardiomegaly. There is no pneumothorax.  IMPRESSION: Extubated. Improved, with partial clearance of central and basilar opacities bilaterally.   Electronically Signed   By: Ellery Plunk M.D.   On: 09/26/2014 23:11    Scheduled Meds: . aspirin  81 mg Per Tube Daily  . atorvastatin  20 mg Oral q1800  . azithromycin  500 mg Oral Daily  . cefTRIAXone (ROCEPHIN)  IV  1 g Intravenous Q24H  . clopidogrel  75 mg Per Tube Daily  . furosemide  40 mg Oral Daily  . gabapentin  800 mg Oral QID  . heparin  5,000 Units Subcutaneous 3 times per day  . insulin aspart  0-9 Units Subcutaneous TID WC  . ipratropium-albuterol  3 mL Nebulization Q6H  . nicotine  21 mg Transdermal Daily  . pantoprazole  40 mg Oral BID  . QUEtiapine  200 mg Oral QHS  . vancomycin  1,000 mg Intravenous Q8H   Continuous Infusions:   Active Problems:   Respiratory failure   Acute respiratory failure with hypoxia   CAP (community acquired pneumonia)   Emphysema of lung    Time spent: 25 minutes.     [facility]  [facility] Pager [phone]. If 7PM-7AM, please contact night-coverage at [email or web address], password [facility] 09/28/2014, 3:44 PM  LOS: 2 days

## 2014-09-28 NOTE — Progress Notes (Signed)
Patient refuses CPAP. He stated he can't sleep with it on. He was told to call the RT if he wants it later. RT will continue to monitor.

## 2014-09-28 NOTE — Progress Notes (Signed)
Pt refuses CPAP at this time. He is aware to call the RT if he wants it later.

## 2014-09-29 ENCOUNTER — Inpatient Hospital Stay (HOSPITAL_COMMUNITY): Payer: Medicaid - Out of State

## 2014-09-29 DIAGNOSIS — J439 Emphysema, unspecified: Secondary | ICD-10-CM

## 2014-09-29 LAB — BASIC METABOLIC PANEL
Anion gap: 7 (ref 5–15)
BUN: 20 mg/dL (ref 6–23)
CO2: 31 mmol/L (ref 19–32)
CREATININE: 1.12 mg/dL (ref 0.50–1.35)
Calcium: 8.9 mg/dL (ref 8.4–10.5)
Chloride: 99 mmol/L (ref 96–112)
GFR calc non Af Amer: 68 mL/min — ABNORMAL LOW (ref >90)
GFR, EST AFRICAN AMERICAN: 79 mL/min — AB (ref >90)
Glucose, Bld: 113 mg/dL — ABNORMAL HIGH (ref 70–99)
Potassium: 3.5 mmol/L (ref 3.5–5.1)
Sodium: 137 mmol/L (ref 135–145)

## 2014-09-29 LAB — CBC
HCT: 34.9 % — ABNORMAL LOW (ref 39.0–52.0)
Hemoglobin: 10.9 g/dL — ABNORMAL LOW (ref 13.0–17.0)
MCH: 27.1 pg (ref 26.0–34.0)
MCHC: 31.2 g/dL (ref 30.0–36.0)
MCV: 86.8 fL (ref 78.0–100.0)
Platelets: 251 10*3/uL (ref 150–400)
RBC: 4.02 MIL/uL — ABNORMAL LOW (ref 4.22–5.81)
RDW: 16.5 % — AB (ref 11.5–15.5)
WBC: 5.6 10*3/uL (ref 4.0–10.5)

## 2014-09-29 LAB — GLUCOSE, CAPILLARY
Glucose-Capillary: 105 mg/dL — ABNORMAL HIGH (ref 70–99)
Glucose-Capillary: 93 mg/dL (ref 70–99)

## 2014-09-29 MED ORDER — IPRATROPIUM-ALBUTEROL 0.5-2.5 (3) MG/3ML IN SOLN: 3.0000 mL | Freq: Four times a day (QID) | RESPIRATORY_TRACT | Status: AC | PRN

## 2014-09-29 MED ORDER — LEVOFLOXACIN 750 MG PO TABS: 750.0000 mg | ORAL_TABLET | Freq: Every day | ORAL | Status: AC

## 2014-09-29 MED ORDER — ALBUTEROL SULFATE (2.5 MG/3ML) 0.083% IN NEBU: 2.5000 mg | INHALATION_SOLUTION | RESPIRATORY_TRACT | Status: AC | PRN

## 2014-09-29 MED ORDER — IPRATROPIUM-ALBUTEROL 0.5-2.5 (3) MG/3ML IN SOLN
3.0000 mL | Freq: Two times a day (BID) | RESPIRATORY_TRACT | Status: DC
  Administered 2014-09-29: 3 mL via RESPIRATORY_TRACT
  Filled 2014-09-29: qty 3

## 2014-09-29 NOTE — Care Management Note (Signed)
    Page 1 of 2   09/29/2014     3:15:55 PM CARE MANAGEMENT NOTE 09/29/2014  Patient:  Eric Hensley, Eric Hensley   Account Number:  [id number]  Date Initiated:  09/26/2014  Documentation initiated by:  [facility]  Subjective/Objective Assessment:   Admitted with resp failure - intubated.     Action/Plan:   discharge planning   Anticipated DC Date:  10/03/2014   Anticipated DC Plan:  [place]  CM consult      Choice offered to / List presented to:     DME arranged  PULSE OXIMETER  NEBULIZER MACHINE      DME agency  [facility]        Status of service:  Completed, signed off Medicare Important Message given?   (If response is "NO", the following Medicare IM given date fields will be blank) Date Medicare IM given:   Medicare IM given by:   Date Additional Medicare IM given:   Additional Medicare IM given by:    Discharge Disposition:  HOME/SELF CARE  Per UR Regulation:  Reviewed for med. necessity/level of care/duration of stay  If discussed at [place] of Stay Meetings, dates discussed:    Comments:  09/29/14 14:30 CM notes pt was evaluated by PT and sats were 94% or greater on Room Air.  Cm called MD who states pt does not need home oxygen.  CM met with pt and girlfriend in room for discharge planning. MD has orders for pulse oximeter and nebulizer machine and pt already receives services from a DME provider for his CPAP.  Pt states he gets his [place].  CM called [facility] [phone] and spoke with APRIL.  CM received callback from [facility] who requests orders to be faxed to [phone]. No other CM needs were communicated.  Mariane Masters, [phone].   Contact:  [place]     Rollins,Laura Sister [phone]

## 2014-09-29 NOTE — Progress Notes (Signed)
Nsg Discharge Note  Admit Date:  09/26/2014 Discharge date: 09/29/2014   Judeth Horn to be D/C'd Home per MD order.  AVS completed.  Copy for chart, and copy for patient signed, and dated. Patient/caregiver able to verbalize understanding.  Discharge Medication:   Medication List    STOP taking these medications        lisinopril 10 MG tablet  Commonly known as:  PRINIVIL,ZESTRIL      TAKE these medications        Aclidinium Bromide 400 MCG/ACT Aepb  Inhale 1 puff into the lungs 2 (two) times daily.     albuterol 108 (90 BASE) MCG/ACT inhaler  Commonly known as:  PROVENTIL HFA;VENTOLIN HFA  Inhale 1 puff into the lungs every 4 (four) hours as needed for wheezing or shortness of breath.     albuterol (2.5 MG/3ML) 0.083% nebulizer solution  Commonly known as:  PROVENTIL  Take 3 mLs (2.5 mg total) by nebulization every 2 (two) hours as needed for wheezing or shortness of breath.     ALPRAZolam 1 MG tablet  Commonly known as:  XANAX  Take 1 mg by mouth 3 (three) times daily as needed for anxiety.     aspirin 81 MG tablet  Take 81 mg by mouth daily.     atorvastatin 80 MG tablet  Commonly known as:  LIPITOR  Take 80 mg by mouth at bedtime.     carvedilol 12.5 MG tablet  Commonly known as:  COREG  Take 6.25 mg by mouth 2 (two) times daily with a meal.     clopidogrel 75 MG tablet  Commonly known as:  PLAVIX  Take 75 mg by mouth daily.     docusate sodium 100 MG capsule  Commonly known as:  COLACE  Take 100 mg by mouth 2 (two) times daily as needed for mild constipation.     fexofenadine 60 MG tablet  Commonly known as:  ALLEGRA  Take 60 mg by mouth 2 (two) times daily.     Fluticasone-Salmeterol 250-50 MCG/DOSE Aepb  Commonly known as:  ADVAIR  Inhale 1 puff into the lungs 2 (two) times daily.     furosemide 40 MG tablet  Commonly known as:  LASIX  Take 40 mg by mouth daily.     gabapentin 800 MG tablet  Commonly known as:  NEURONTIN  Take 800 mg by mouth  4 (four) times daily.     ipratropium-albuterol 0.5-2.5 (3) MG/3ML Soln  Commonly known as:  DUONEB  Take 3 mLs by nebulization every 6 (six) hours as needed.     levofloxacin 750 MG tablet  Commonly known as:  LEVAQUIN  Take 1 tablet (750 mg total) by mouth daily.     meclizine 25 MG tablet  Commonly known as:  ANTIVERT  Take 25 mg by mouth 2 (two) times daily as needed for dizziness.     nicotine 14 mg/24hr patch  Commonly known as:  NICODERM CQ - dosed in mg/24 hours  Place 14 mg onto the skin daily.     nitroGLYCERIN 0.4 MG SL tablet  Commonly known as:  NITROSTAT  Place 0.4 mg under the tongue every 5 (five) minutes as needed for chest pain.     omeprazole 40 MG capsule  Commonly known as:  PRILOSEC  Take 40 mg by mouth daily.     ondansetron 4 MG tablet  Commonly known as:  ZOFRAN  Take 4 mg by mouth every 8 (eight) hours as needed  for nausea or vomiting.     oxycodone 5 MG capsule  Commonly known as:  OXY-IR  Take 5 mg by mouth 5 (five) times daily.     OXYGEN  Inhale 3 L into the lungs daily.     PRESCRIPTION MEDICATION  CPAP MACHINE WHEN SLEEPING     QUEtiapine 200 MG tablet  Commonly known as:  SEROQUEL  Take 200 mg by mouth at bedtime.        Discharge Assessment: Filed Vitals:   09/29/14 0511  BP: 105/63  Pulse: 90  Temp: 98.2 F (36.8 C)  Resp: 16   Skin clean, dry and intact without evidence of skin break down, no evidence of skin tears noted. IV catheter discontinued intact. Site without signs and symptoms of complications - no redness or edema noted at insertion site, patient denies c/o pain - only slight tenderness at site.  Dressing with slight pressure applied.  D/c Instructions-Education: Discharge instructions given to patient/family with verbalized understanding. D/c education completed with patient/family including follow up instructions, medication list, d/c activities limitations if indicated, with other d/c instructions as  indicated by MD - patient able to verbalize understanding, all questions fully answered. Patient instructed to return to ED, call 911, or call MD for any changes in condition.  Patient escorted via WC, and D/C home via private auto.  Kern ReapBrumagin, Hafiz Irion L, RN 09/29/2014 2:42 PM

## 2014-09-29 NOTE — Evaluation (Signed)
Physical Therapy Evaluation Patient Details Name: Eric Hensley MRN: [id number] DOB: Aug 03, 1951 Today's Date: 09/29/2014   History of Present Illness  64 y.o. male admitted with Acute on chronic respiratory failure probably secondary to Hcap, copd exacerbation and acute on chronic systolic heart failure  Clinical Impression  Pt admitted with above diagnosis. Pt currently with functional limitations due to the deficits listed below (see PT Problem List). Ambulates up to 475 feet with supervision, using a rolling walker for support. SpO2 maintained 94% and greater throughout therapy session while on room air. Patient and family states he is back to his baseline with mobility. Feel he is adequate for d/c from a mobility standpoint when medically ready.  Follow Up Recommendations Outpatient PT    Equipment Recommendations  None recommended by PT    Recommendations for Other Services       Precautions / Restrictions Precautions Precautions: None Restrictions Weight Bearing Restrictions: No      Mobility  Bed Mobility Overal bed mobility: Modified Independent                Transfers Overall transfer level: Modified independent                  Ambulation/Gait Ambulation/Gait assistance: Supervision Ambulation Distance (Feet): 475 Feet Assistive device: Rolling walker (2 wheeled) Gait Pattern/deviations: Step-through pattern;Decreased step length - left;Decreased stance time - right;Antalgic;Trunk flexed Gait velocity: decreased   General Gait Details: Slow gait speed, however good stability with use of a rolling walker for support. Needs frequent cues for upright posture. Moderately antalgic with poor foot control during heel strike on Rt. Educated on safe DME use with this device and encouraged to stand closer to the RW for improved UE support. No loss of balance, SpO2 94-97% during bout on room air, HR 90s - 117.  Stairs            Wheelchair Mobility     Modified Rankin (Stroke Patients Only)       Balance Overall balance assessment: Needs assistance Sitting-balance support: Feet supported;No upper extremity supported Sitting balance-Leahy Scale: Normal     Standing balance support: Single extremity supported Standing balance-Leahy Scale: Poor                               Pertinent Vitals/Pain Pain Assessment: 0-10 Pain Score:  ("right leg hurts" no value given) Pain Location: RLE Pain Intervention(s): Monitored during session;Repositioned;Patient requesting pain meds-RN notified    Home Living Family/patient expects to be discharged to:: Private residence Living Arrangements: Spouse/significant other;Children Available Help at Discharge: Family;Available 24 hours/day Type of Home: Mobile home Home Access: Ramped entrance     Home Layout: One level Home Equipment: Walker - 4 wheels;Wheelchair - Engineer, technical salesmanual;Wheelchair - power;Bedside commode;Shower seat;Cane - quad;Hospital bed      Prior Function Level of Independence: Needs assistance   Gait / Transfers Assistance Needed: uses rollator when ambulating. Power wheelchair when in community  ADL's / Homemaking Assistance Needed: Needs assist for bath/dress        Hand Dominance   Dominant Hand: Right    Extremity/Trunk Assessment   Upper Extremity Assessment: Defer to OT evaluation           Lower Extremity Assessment: RLE deficits/detail RLE Deficits / Details: History of RLE difficulties. Limited weight bearing due to pain.       Communication   Communication: No difficulties  Cognition Arousal/Alertness: Awake/alert Behavior During Therapy:  WFL for tasks assessed/performed Overall Cognitive Status: Within Functional Limits for tasks assessed                      General Comments General comments (skin integrity, edema, etc.): Discussed safety with mobility while family present, including monitoring of SpO2 and HR while pt is  mobile. They verbalize understanding.     Exercises        Assessment/Plan    PT Assessment Patient needs continued PT services  PT Diagnosis Difficulty walking;Abnormality of gait;Acute pain   PT Problem List Decreased strength;Decreased range of motion;Decreased activity tolerance;Decreased balance;Decreased mobility;Decreased coordination;Decreased knowledge of use of DME;Pain  PT Treatment Interventions DME instruction;Gait training;Functional mobility training;Therapeutic activities;Therapeutic exercise;Neuromuscular re-education;Balance training;Patient/family education;Modalities   PT Goals (Current goals can be found in the Care Plan section) Acute Rehab PT Goals Patient Stated Goal: go home PT Goal Formulation: With patient Time For Goal Achievement: 10/13/14 Potential to Achieve Goals: Good    Frequency Min 3X/week   Barriers to discharge        Co-evaluation               End of Session Equipment Utilized During Treatment: Gait belt Activity Tolerance: Patient tolerated treatment well Patient left: in chair;with call bell/phone within reach;with family/visitor present Nurse Communication: Mobility status;Other (comment) (SpO2 94% and greater during therapy on room air.)         Time: 8295-6213 PT Time Calculation (min) (ACUTE ONLY): 33 min   Charges:   PT Evaluation $Initial PT Evaluation Tier I: 1 Procedure PT Treatments $Gait Training: 8-22 mins   PT G Codes:        Berton Mount 09/29/2014, 12:45 PM Sunday Spill[place], [place] [phone]

## 2014-09-30 LAB — HEMOGLOBIN A1C
Hgb A1c MFr Bld: 5.5 % (ref 4.8–5.6)
MEAN PLASMA GLUCOSE: 111 mg/dL

## 2014-09-30 NOTE — Discharge Summary (Signed)
Physician Discharge Summary  Eric Hensley ZHY:[id number] DOB: 05-May-1951 DOA: 09/26/2014  PCP: PROVIDER NOT IN SYSTEM  Admit date: 09/26/2014 Discharge date: 09/29/2014  Time spent: 35 minutes  Recommendations for Outpatient Follow-up:  1. Follow  Up with PCP in 1 to2 weeks post hospitalization visit.   Discharge Diagnoses:  Active Problems:   Respiratory failure   Acute respiratory failure with hypoxia   CAP (community acquired pneumonia)   Emphysema of lung OSA  Discharge Condition: improved  Diet recommendation: low sodium diet.   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 He was started on broad spectrum IV antibiotics and later on changed to levaquin to complete the course on discharge.   Hospital Course:  1. Acute on chronic respiratory failure probably secondary to Hcap, copd exacerbation and acute on chronic systolic heart failure. He was initially admitted to ICU and required mechanical ventilation , later on he self extubated and he was transferred to telemety. He was started on rocephin and zithromax. Continue with duonebs as needed. Stopped the vancomycin and MRSA swab is negative.. Ambulatory oxygen sats have been greater than 93% and he was discharged home on nebs.    2. CAD and acute on chronic systolic heart failure: He appears to be compensated. Resume lasix, lipitor.    3. Tobacco abuse: On nicotine patch.   Chronic back pain and leg pain/ neuropathy: on neurontin and pain meds.   Procedures:  none  Consultations:  PCCM   Discharge Exam: Filed Vitals:   09/29/14 0511  BP: 105/63  Pulse: 90  Temp: 98.2 F  (36.8 C)  Resp: 16    General: alert afebrile comfortable Cardiovascular: s1s2 Respiratory: ctab  Discharge Instructions   Discharge Instructions    Diet - low sodium heart healthy    Complete by:  As directed      Discharge instructions    Complete by:  As directed   Follow up with PCP in less than a week. Please complete the course of antibiotics.  Stop smoking. And use nicotine patch.          Discharge Medication List as of 09/29/2014  2:09 PM    START taking these medications   Details  ipratropium-albuterol (DUONEB) 0.5-2.5 (3) MG/3ML SOLN Take 3 mLs by nebulization every 6 (six) hours as needed., Starting 09/29/2014, Until Discontinued, Print    levofloxacin (LEVAQUIN) 750 MG tablet Take 1 tablet (750 mg total) by mouth daily., Starting 09/29/2014, Until Discontinued, Print      CONTINUE these medications which have CHANGED   Details  albuterol (PROVENTIL) (2.5 MG/3ML) 0.083% nebulizer solution Take 3 mLs (2.5 mg total) by nebulization every 2 (two) hours as needed for wheezing or shortness of breath., Starting 09/29/2014, Until Discontinued, Print      CONTINUE these medications which have NOT CHANGED   Details  Aclidinium Bromide 400 MCG/ACT AEPB Inhale 1 puff into the lungs 2 (two) times daily., Until Discontinued, Historical Med    albuterol (PROVENTIL HFA;VENTOLIN HFA) 108 (90 BASE) MCG/ACT inhaler Inhale 1 puff into the lungs every 4 (four) hours as needed for wheezing  or shortness of breath., Until Discontinued, Historical Med    ALPRAZolam (XANAX) 1 MG tablet Take 1 mg by mouth 3 (three) times daily as needed for anxiety., Until Discontinued, Historical Med    aspirin 81 MG tablet Take 81 mg by mouth daily., Until Discontinued, Historical Med    atorvastatin (LIPITOR) 80 MG tablet Take 80 mg by mouth at bedtime., Until Discontinued, Historical Med    carvedilol (COREG) 12.5 MG tablet Take 6.25 mg by mouth 2 (two) times daily with a meal., Until Discontinued,  Historical Med    clopidogrel (PLAVIX) 75 MG tablet Take 75 mg by mouth daily., Until Discontinued, Historical Med    docusate sodium (COLACE) 100 MG capsule Take 100 mg by mouth 2 (two) times daily as needed for mild constipation., Until Discontinued, Historical Med    fexofenadine (ALLEGRA) 60 MG tablet Take 60 mg by mouth 2 (two) times daily., Until Discontinued, Historical Med    Fluticasone-Salmeterol (ADVAIR) 250-50 MCG/DOSE AEPB Inhale 1 puff into the lungs 2 (two) times daily., Until Discontinued, Historical Med    furosemide (LASIX) 40 MG tablet Take 40 mg by mouth daily., Until Discontinued, Historical Med    gabapentin (NEURONTIN) 800 MG tablet Take 800 mg by mouth 4 (four) times daily., Until Discontinued, Historical Med    meclizine (ANTIVERT) 25 MG tablet Take 25 mg by mouth 2 (two) times daily as needed for dizziness., Until Discontinued, Historical Med    nicotine (NICODERM CQ - DOSED IN MG/24 HOURS) 14 mg/24hr patch Place 14 mg onto the skin daily., Until Discontinued, Historical Med    nitroGLYCERIN (NITROSTAT) 0.4 MG SL tablet Place 0.4 mg under the tongue every 5 (five) minutes as needed for chest pain., Until Discontinued, Historical Med    omeprazole (PRILOSEC) 40 MG capsule Take 40 mg by mouth daily., Until Discontinued, Historical Med    ondansetron (ZOFRAN) 4 MG tablet Take 4 mg by mouth every 8 (eight) hours as needed for nausea or vomiting., Until Discontinued, Historical Med    oxycodone (OXY-IR) 5 MG capsule Take 5 mg by mouth 5 (five) times daily., Until Discontinued, Historical Med    OXYGEN Inhale 3 L into the lungs daily., Until Discontinued, Historical Med    PRESCRIPTION MEDICATION CPAP MACHINE WHEN SLEEPING, Until Discontinued, Historical Med    QUEtiapine (SEROQUEL) 200 MG tablet Take 200 mg by mouth at bedtime., Until Discontinued, Historical Med      STOP taking these medications     lisinopril (PRINIVIL,ZESTRIL) 10 MG tablet         Allergies  Allergen Reactions  . Eggs Or Egg-Derived Products Nausea And Vomiting   Follow-up Information    Follow up with [facility].   Why:  will provide your pulse oximetry and nebulizer machine   Contact information:   [place], [place] [phone]       The results of significant diagnostics from this hospitalization (including imaging, microbiology, ancillary and laboratory) are listed below for reference.    Significant Diagnostic Studies: Dg Chest 2 View  09/29/2014   CLINICAL DATA:  Pneumonia, followup, history hypertension, diabetes, CHF  EXAM: CHEST  2 VIEW  COMPARISON:  09/26/2014  FINDINGS: Enlargement of cardiac silhouette post median sternotomy.  Mediastinal contours and pulmonary vascularity normal.  Minimal central peribronchial thickening.  Improved bibasilar infiltrates.  Remaining lungs clear.  No pleural effusion or pneumothorax.  Bones demineralized.  IMPRESSION: Enlargement of cardiac silhouette.  Improved bibasilar infiltrates.   Electronically Signed   By: Loraine Leriche  Tyron Russell M.D.   On: 09/29/2014 08:45   Dg Chest Port 1 View  09/26/2014   CLINICAL DATA:  Dyspnea.  Self extubation.  EXAM: PORTABLE CHEST - 1 VIEW  COMPARISON:  09/26/2014 at 3:13  FINDINGS: Endotracheal tube has been removed. There are central and basilar opacities bilaterally, partially cleared compared to the earlier study. No large effusions are evident. There is moderate unchanged cardiomegaly. There is no pneumothorax.  IMPRESSION: Extubated. Improved, with partial clearance of central and basilar opacities bilaterally.   Electronically Signed   By: Ellery Plunk M.D.   On: 09/26/2014 23:11   Portable Chest Xray  09/26/2014   CLINICAL DATA:  Hypoxia, dyspnea.  Intubated.  EXAM: PORTABLE CHEST - 1 VIEW  COMPARISON:  None.  FINDINGS: The endotracheal tube tip is just below the level of the clavicular heads.  There is prior sternotomy. There is moderate cardiomegaly. There are basilar  opacities bilaterally, extending up into the central perihilar regions. There is no pneumothorax. Vascular and interstitial congestive changes are present.  IMPRESSION: ETT tip just below the clavicular heads.  The vascular and interstitial changes suggest congestive heart failure. Alveolar edema or infiltrates are present in the central and basilar regions.   Electronically Signed   By: Ellery Plunk M.D.   On: 09/26/2014 03:30   Dg Abd Portable 1v  09/26/2014   CLINICAL DATA:  Or gastric tube placement  EXAM: PORTABLE ABDOMEN - 1 VIEW  COMPARISON:  None.  FINDINGS: The orogastric tube extends through the stomach with tip in the duodenal bulb  IMPRESSION: Gastric tube tip in the duodenal bulb   Electronically Signed   By: Ellery Plunk M.D.   On: 09/26/2014 04:09    Microbiology: Recent Results (from the past 240 hour(s))  MRSA PCR Screening     Status: None   Collection Time: 09/26/14  2:47 AM  Result Value Ref Range Status   MRSA by PCR NEGATIVE NEGATIVE Final    Comment:        The GeneXpert MRSA Assay (FDA approved for NASAL specimens only), is one component of a comprehensive MRSA colonization surveillance program. It is not intended to diagnose MRSA infection nor to guide or monitor treatment for MRSA infections.   Culture, respiratory (NON-Expectorated)     Status: None   Collection Time: 09/26/14 11:40 AM  Result Value Ref Range Status   Specimen Description TRACHEAL ASPIRATE  Final   Special Requests NONE  Final   Gram Stain   Final    FEW WBC PRESENT, PREDOMINANTLY PMN NO SQUAMOUS EPITHELIAL CELLS SEEN FEW GRAM POSITIVE COCCI IN PAIRS Performed at [facility]    Culture   Final    MODERATE STREPTOCOCCUS,BETA HEMOLYTIC NOT GROUP A Performed at [facility]    Report Status 09/28/2014 FINAL  Final     Labs: Basic Metabolic Panel:  Recent Labs Lab 09/26/14 0330 09/27/14 0850 09/29/14 0609  NA 138 135 137  K 4.0 4.0 3.5  CL 101 96 99   CO2 30 31 31   GLUCOSE 108* 112* 113*  BUN 13 18 20   CREATININE 1.02 1.04 1.12  CALCIUM 8.9 9.0 8.9   Liver Function Tests:  Recent Labs Lab 09/26/14 0330  AST 15  ALT 13  ALKPHOS 79  BILITOT 1.1  PROT 7.1  ALBUMIN 3.3*   No results for input(s): LIPASE, AMYLASE in the last 168 hours. No results for input(s): AMMONIA in the last 168 hours. CBC:  Recent Labs Lab 09/26/14  0330 09/27/14 0850 09/29/14 0609  WBC 8.1 9.9 5.6  NEUTROABS 5.8  --   --   HGB 11.1* 10.7* 10.9*  HCT 35.2* 34.7* 34.9*  MCV 85.9 87.0 86.8  PLT 294 293 251   Cardiac Enzymes:  Recent Labs Lab 09/26/14 0330 09/26/14 1440 09/26/14 2052 09/27/14 0315 09/27/14 0850  TROPONINI 0.13* 0.12* 0.09* 0.08* 0.07*   BNP: BNP (last 3 results) No results for input(s): BNP in the last 8760 hours.  ProBNP (last 3 results) No results for input(s): PROBNP in the last 8760 hours.  CBG:  Recent Labs Lab 09/28/14 1153 09/28/14 1657 09/28/14 2209 09/29/14 0752 09/29/14 1151  GLUCAP 118* 103* 86 105* 93       Signed:  Candido Flott  [facility] 09/30/2014, 1:31 AM

## 2016-03-06 IMAGING — CR DG CHEST 1V PORT
1 series · 1 of 1 positions shown · non-contrast
Comparison: None.

CLINICAL DATA: Hypoxia, dyspnea.  Intubated.

EXAM:
PORTABLE CHEST - 1 VIEW

[AP]
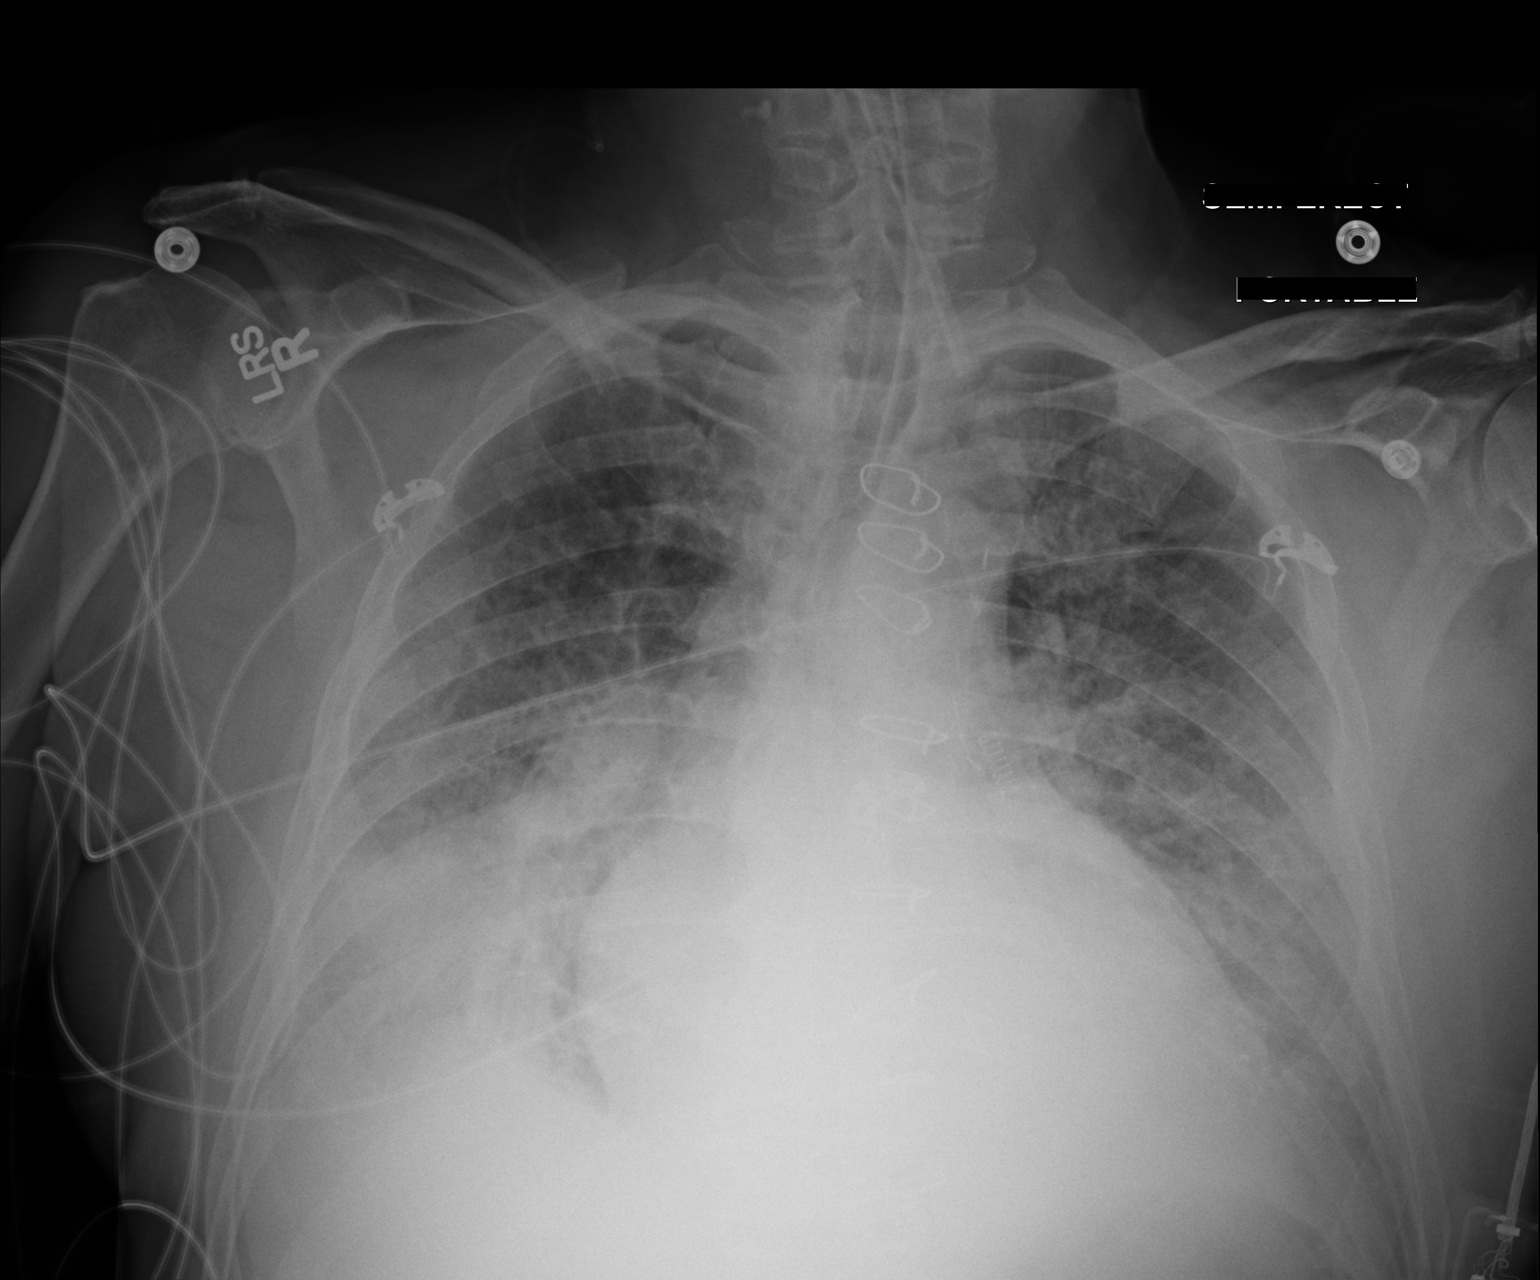

[1 of 1 positions shown; findings below may reference images not displayed]

FINDINGS: The endotracheal tube tip is just below the level of the clavicular
heads.

There is prior sternotomy. There is moderate cardiomegaly. There are
basilar opacities bilaterally, extending up into the central
perihilar regions. There is no pneumothorax. Vascular and
interstitial congestive changes are present.
IMPRESSION: ETT tip just below the clavicular heads.

The vascular and interstitial changes suggest congestive heart
failure. Alveolar edema or infiltrates are present in the central
and basilar regions.

## 2016-03-06 IMAGING — CR DG ABD PORTABLE 1V
1 series · 1 of 1 positions shown · non-contrast
Comparison: None.

CLINICAL DATA: Or gastric tube placement

EXAM:
PORTABLE ABDOMEN - 1 VIEW

[AP]
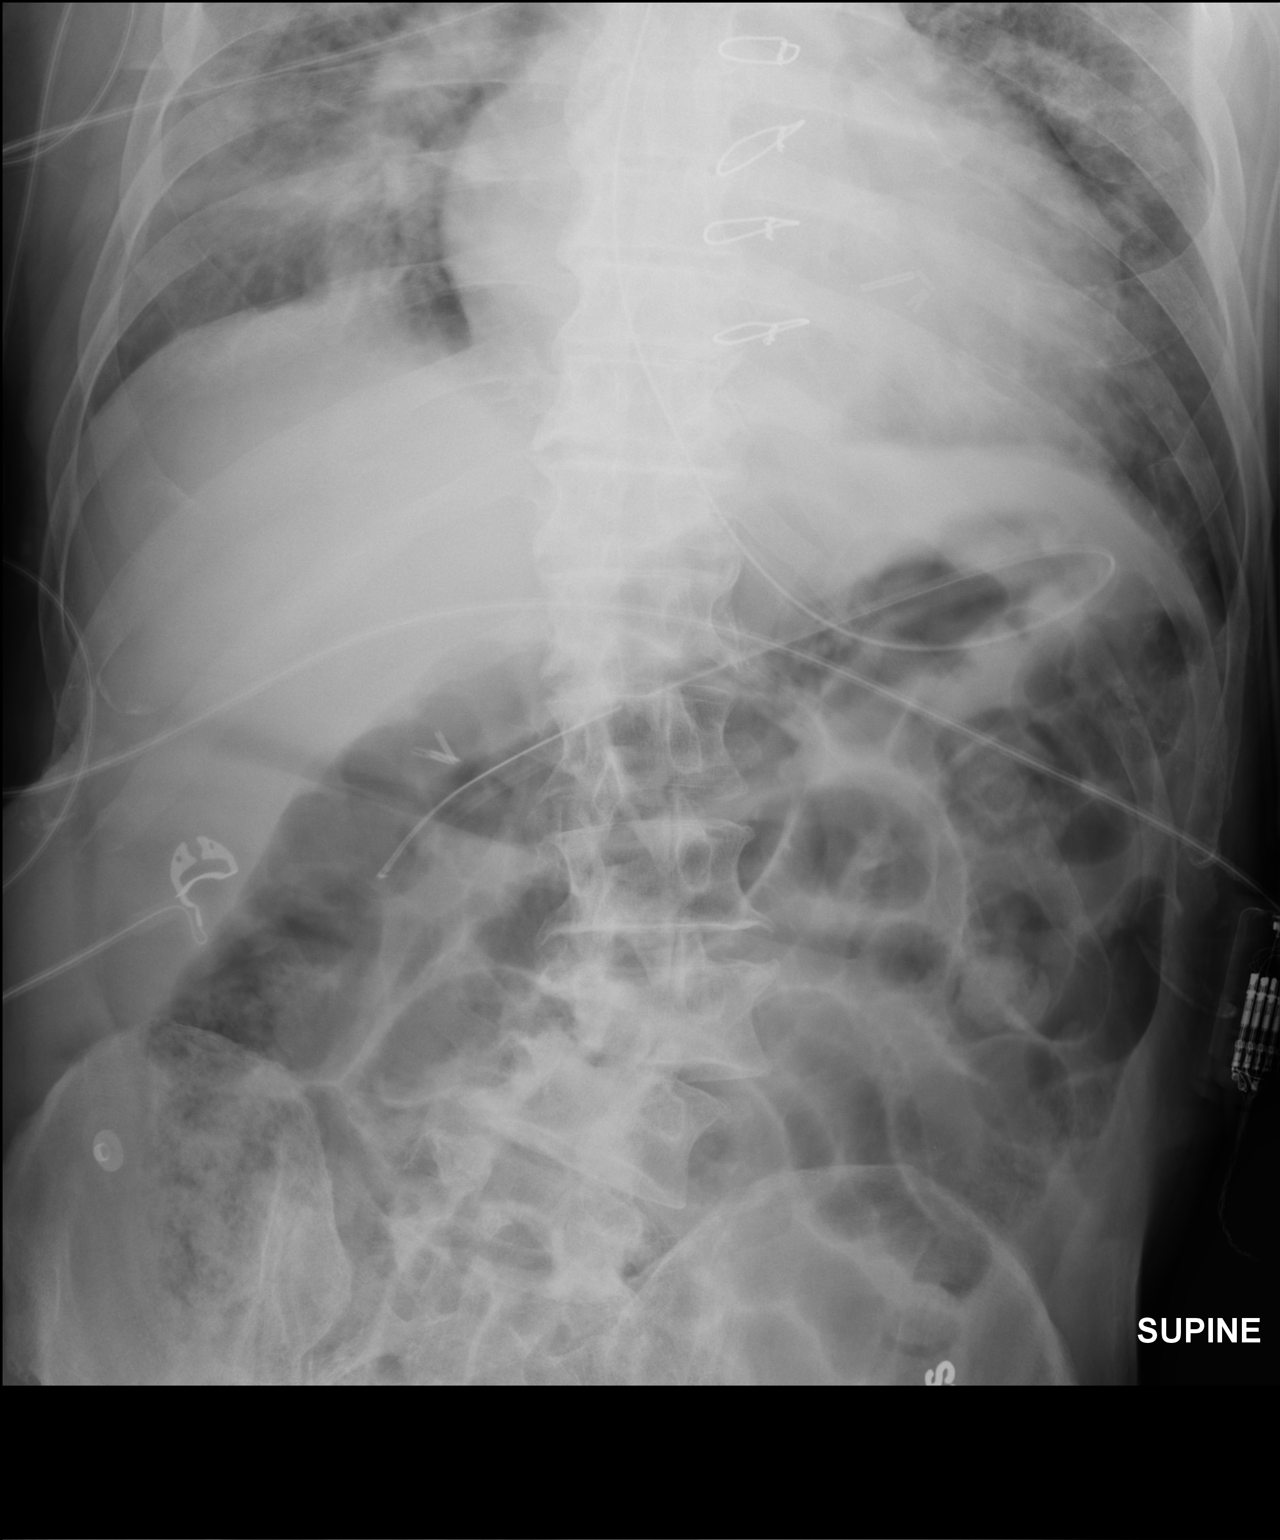

[1 of 1 positions shown; findings below may reference images not displayed]

FINDINGS: The orogastric tube extends through the stomach with tip in the
duodenal bulb
IMPRESSION: Gastric tube tip in the duodenal bulb

## 2016-03-09 IMAGING — DX DG CHEST 2V
2 series · 2 of 2 positions shown · non-contrast
Comparison: 09/26/2014

CLINICAL DATA: Pneumonia, followup, history hypertension, diabetes,
CHF

EXAM:
CHEST  2 VIEW

[chest pa]
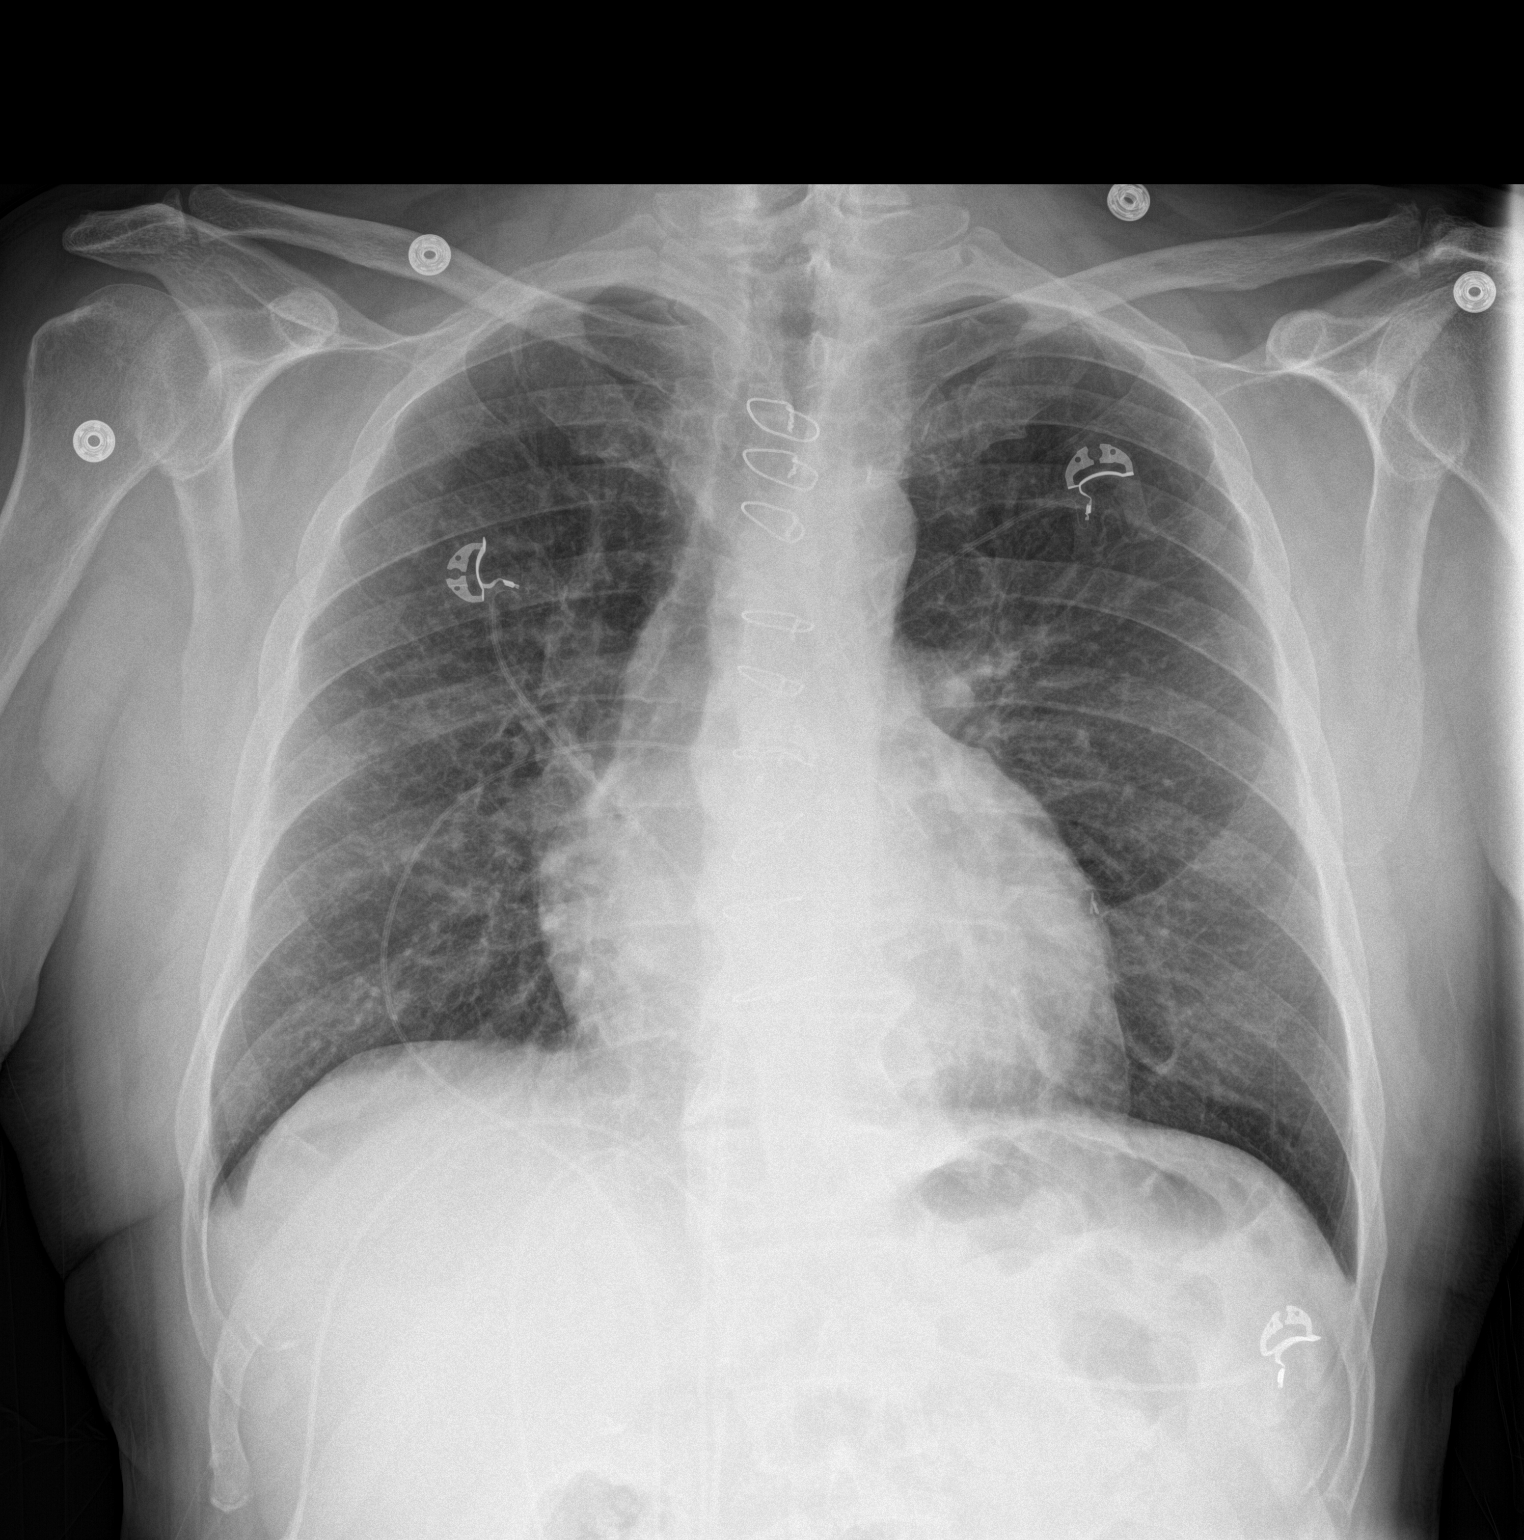

[chest lat]
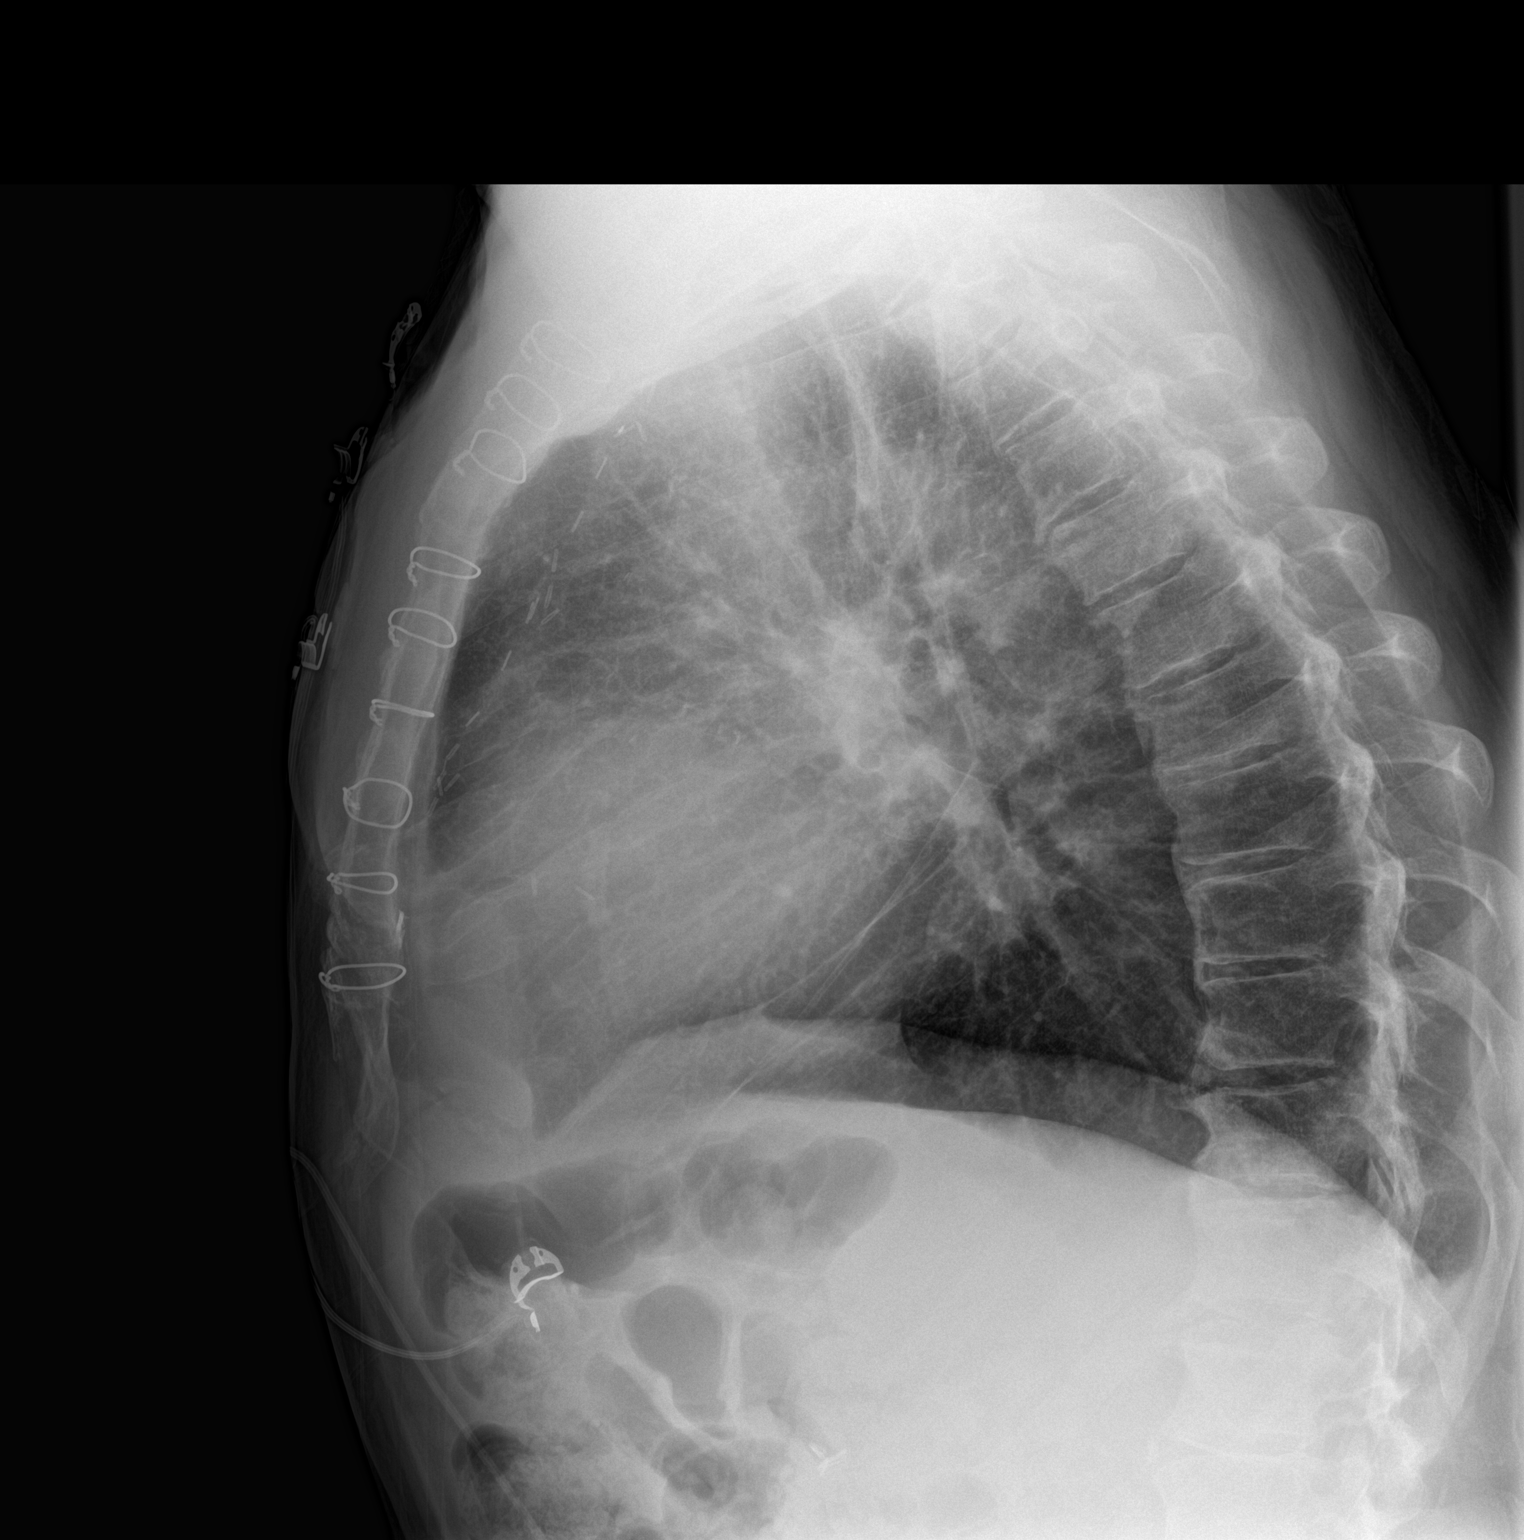

[2 of 2 positions shown; findings below may reference images not displayed]

FINDINGS: Enlargement of cardiac silhouette post median sternotomy.

Mediastinal contours and pulmonary vascularity normal.

Minimal central peribronchial thickening.

Improved bibasilar infiltrates.

Remaining lungs clear.

No pleural effusion or pneumothorax.

Bones demineralized.
IMPRESSION: Enlargement of cardiac silhouette.

Improved bibasilar infiltrates.

## 2016-03-09 DEATH — deceased
